# Patient Record
Sex: Female | Born: 1970 | Race: White | Hispanic: No | Marital: Married | Smoking: Never smoker
Health system: Southern US, Community
[De-identification: ages and names within clinical notes are randomized; demographics above are authoritative.]

## PROBLEM LIST (undated history)

## (undated) ENCOUNTER — Emergency Department (HOSPITAL_BASED_OUTPATIENT_CLINIC_OR_DEPARTMENT_OTHER): Discharge status: Absent without leave | Payer: BC Managed Care – PPO

## (undated) DIAGNOSIS — K219 Gastro-esophageal reflux disease without esophagitis: Secondary | ICD-10-CM

## (undated) DIAGNOSIS — R928 Other abnormal and inconclusive findings on diagnostic imaging of breast: Secondary | ICD-10-CM

## (undated) DIAGNOSIS — Z0000 Encounter for general adult medical examination without abnormal findings: Secondary | ICD-10-CM

## (undated) DIAGNOSIS — F419 Anxiety disorder, unspecified: Secondary | ICD-10-CM

## (undated) DIAGNOSIS — Z5189 Encounter for other specified aftercare: Secondary | ICD-10-CM

## (undated) DIAGNOSIS — S329XXA Fracture of unspecified parts of lumbosacral spine and pelvis, initial encounter for closed fracture: Secondary | ICD-10-CM

## (undated) DIAGNOSIS — B019 Varicella without complication: Secondary | ICD-10-CM

## (undated) DIAGNOSIS — T7840XA Allergy, unspecified, initial encounter: Secondary | ICD-10-CM

## (undated) DIAGNOSIS — R198 Other specified symptoms and signs involving the digestive system and abdomen: Secondary | ICD-10-CM

## (undated) DIAGNOSIS — M25511 Pain in right shoulder: Secondary | ICD-10-CM

## (undated) DIAGNOSIS — R05 Cough: Secondary | ICD-10-CM

## (undated) DIAGNOSIS — M461 Sacroiliitis, not elsewhere classified: Secondary | ICD-10-CM

## (undated) DIAGNOSIS — M25552 Pain in left hip: Secondary | ICD-10-CM

## (undated) HISTORY — PX: WISDOM TOOTH EXTRACTION: SHX21

## (undated) HISTORY — PX: FRACTURE SURGERY: SHX138

## (undated) HISTORY — PX: TOTAL HIP ARTHROPLASTY: SHX124

## (undated) HISTORY — PX: APPENDECTOMY: SHX54

## (undated) HISTORY — PX: TONSILLECTOMY: SUR1361

## (undated) HISTORY — DX: Pain in right shoulder: M25.511

## (undated) HISTORY — PX: BREAST EXCISIONAL BIOPSY: SUR124

## (undated) HISTORY — DX: Sacroiliitis, not elsewhere classified: M46.1

## (undated) HISTORY — PX: ESOPHAGOGASTRODUODENOSCOPY: SHX1529

## (undated) HISTORY — DX: Other specified symptoms and signs involving the digestive system and abdomen: R19.8

## (undated) HISTORY — DX: Cough: R05

## (undated) HISTORY — DX: Encounter for other specified aftercare: Z51.89

## (undated) HISTORY — PX: AUGMENTATION MAMMAPLASTY: SUR837

## (undated) HISTORY — DX: Varicella without complication: B01.9

## (undated) HISTORY — DX: Pain in left hip: M25.552

## (undated) HISTORY — PX: BREAST BIOPSY: SHX20

## (undated) HISTORY — DX: Encounter for general adult medical examination without abnormal findings: Z00.00

## (undated) HISTORY — DX: Allergy, unspecified, initial encounter: T78.40XA

---
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
## 1994-04-26 HISTORY — PX: BLADDER SURGERY: SHX569

## 1998-04-08 ENCOUNTER — Other Ambulatory Visit: Admission: RE | Admit: 1998-04-08 | Discharge: 1998-04-08 | Payer: Self-pay | Admitting: Gynecology

## 1999-04-23 ENCOUNTER — Other Ambulatory Visit: Admission: RE | Admit: 1999-04-23 | Discharge: 1999-04-23 | Payer: Self-pay | Admitting: Gynecology

## 1999-07-02 ENCOUNTER — Encounter: Payer: Self-pay | Admitting: Obstetrics and Gynecology

## 1999-07-02 ENCOUNTER — Ambulatory Visit (HOSPITAL_COMMUNITY): Admission: RE | Admit: 1999-07-02 | Discharge: 1999-07-02 | Payer: Self-pay | Admitting: Obstetrics and Gynecology

## 2000-10-24 ENCOUNTER — Other Ambulatory Visit: Admission: RE | Admit: 2000-10-24 | Discharge: 2000-10-24 | Payer: Self-pay | Admitting: Obstetrics and Gynecology

## 2001-06-21 ENCOUNTER — Other Ambulatory Visit: Admission: RE | Admit: 2001-06-21 | Discharge: 2001-06-21 | Payer: Self-pay | Admitting: Gynecology

## 2002-07-11 ENCOUNTER — Other Ambulatory Visit: Admission: RE | Admit: 2002-07-11 | Discharge: 2002-07-11 | Payer: Self-pay | Admitting: Obstetrics and Gynecology

## 2003-07-17 ENCOUNTER — Other Ambulatory Visit: Admission: RE | Admit: 2003-07-17 | Discharge: 2003-07-17 | Payer: Self-pay | Admitting: Obstetrics and Gynecology

## 2004-08-21 ENCOUNTER — Other Ambulatory Visit: Admission: RE | Admit: 2004-08-21 | Discharge: 2004-08-21 | Payer: Self-pay | Admitting: Obstetrics and Gynecology

## 2006-04-26 HISTORY — PX: BREAST SURGERY: SHX581

## 2006-04-26 HISTORY — PX: BREAST ENHANCEMENT SURGERY: SHX7

## 2007-09-25 ENCOUNTER — Inpatient Hospital Stay (HOSPITAL_COMMUNITY): Admission: AD | Admit: 2007-09-25 | Discharge: 2007-09-25 | Payer: Self-pay | Admitting: Obstetrics and Gynecology

## 2009-01-29 ENCOUNTER — Inpatient Hospital Stay (HOSPITAL_COMMUNITY): Admission: AD | Admit: 2009-01-29 | Discharge: 2009-02-05 | Payer: Self-pay | Admitting: Obstetrics and Gynecology

## 2009-01-29 ENCOUNTER — Ambulatory Visit: Payer: Self-pay | Admitting: Internal Medicine

## 2009-01-29 ENCOUNTER — Ambulatory Visit: Payer: Self-pay | Admitting: Family Medicine

## 2009-02-01 ENCOUNTER — Encounter (HOSPITAL_COMMUNITY): Payer: Self-pay | Admitting: Obstetrics and Gynecology

## 2009-02-06 ENCOUNTER — Encounter: Admission: RE | Admit: 2009-02-06 | Discharge: 2009-03-08 | Payer: Self-pay | Admitting: Obstetrics and Gynecology

## 2009-03-09 ENCOUNTER — Encounter: Admission: RE | Admit: 2009-03-09 | Discharge: 2009-04-07 | Payer: Self-pay | Admitting: Obstetrics and Gynecology

## 2009-03-13 ENCOUNTER — Ambulatory Visit: Payer: Self-pay | Admitting: Physician Assistant

## 2009-03-13 ENCOUNTER — Inpatient Hospital Stay (HOSPITAL_COMMUNITY): Admission: AD | Admit: 2009-03-13 | Discharge: 2009-03-13 | Payer: Self-pay | Admitting: Obstetrics and Gynecology

## 2009-04-08 ENCOUNTER — Encounter: Admission: RE | Admit: 2009-04-08 | Discharge: 2009-04-24 | Payer: Self-pay | Admitting: Obstetrics and Gynecology

## 2009-05-09 ENCOUNTER — Encounter: Admission: RE | Admit: 2009-05-09 | Discharge: 2009-06-08 | Payer: Self-pay | Admitting: Obstetrics and Gynecology

## 2009-06-09 ENCOUNTER — Encounter: Admission: RE | Admit: 2009-06-09 | Discharge: 2009-07-09 | Payer: Self-pay | Admitting: Obstetrics and Gynecology

## 2009-08-07 ENCOUNTER — Encounter: Admission: RE | Admit: 2009-08-07 | Discharge: 2009-09-06 | Payer: Self-pay | Admitting: Obstetrics and Gynecology

## 2009-09-07 ENCOUNTER — Encounter: Admission: RE | Admit: 2009-09-07 | Discharge: 2009-10-07 | Payer: Self-pay | Admitting: Obstetrics and Gynecology

## 2009-10-08 ENCOUNTER — Encounter: Admission: RE | Admit: 2009-10-08 | Discharge: 2009-11-07 | Payer: Self-pay | Admitting: Obstetrics and Gynecology

## 2009-11-08 ENCOUNTER — Encounter: Admission: RE | Admit: 2009-11-08 | Discharge: 2009-12-08 | Payer: Self-pay | Admitting: Obstetrics and Gynecology

## 2009-12-09 ENCOUNTER — Encounter: Admission: RE | Admit: 2009-12-09 | Discharge: 2010-01-08 | Payer: Self-pay | Admitting: Obstetrics and Gynecology

## 2010-01-09 ENCOUNTER — Encounter: Admission: RE | Admit: 2010-01-09 | Discharge: 2010-01-14 | Payer: Self-pay | Admitting: Obstetrics and Gynecology

## 2010-07-29 LAB — DIFFERENTIAL
Basophils Absolute: 0 10*3/uL (ref 0.0–0.1)
Basophils Relative: 0 % (ref 0–1)
Eosinophils Absolute: 0.3 10*3/uL (ref 0.0–0.7)
Lymphocytes Relative: 32 % (ref 12–46)
Lymphs Abs: 1.9 10*3/uL (ref 0.7–4.0)
Monocytes Relative: 12 % (ref 3–12)
Neutrophils Relative %: 50 % (ref 43–77)

## 2010-07-29 LAB — BLOOD GAS, ARTERIAL
Acid-Base Excess: 0 mmol/L (ref 0.0–2.0)
FIO2: 0.21 %
O2 Saturation: 99 %
TCO2: 26 mmol/L (ref 0–100)
pCO2 arterial: 42.8 mmHg (ref 35.0–45.0)
pO2, Arterial: 86.8 mmHg (ref 80.0–100.0)

## 2010-07-29 LAB — CBC
Hemoglobin: 11.7 g/dL — ABNORMAL LOW (ref 12.0–15.0)
Platelets: 221 10*3/uL (ref 150–400)
RDW: 12.4 % (ref 11.5–15.5)

## 2010-07-30 LAB — BLOOD GAS, ARTERIAL
Drawn by: 24517
O2 Saturation: 100 %
O2 Saturation: 97 %
TCO2: 24.9 mmol/L (ref 0–100)
TCO2: 26.6 mmol/L (ref 0–100)
pCO2 arterial: 35 mmHg (ref 35.0–45.0)
pH, Arterial: 7.475 — ABNORMAL HIGH (ref 7.350–7.400)
pO2, Arterial: 148 mmHg — ABNORMAL HIGH (ref 80.0–100.0)
pO2, Arterial: 74.7 mmHg — ABNORMAL LOW (ref 80.0–100.0)

## 2010-07-30 LAB — COMPREHENSIVE METABOLIC PANEL
AST: 26 U/L (ref 0–37)
Albumin: 2.7 g/dL — ABNORMAL LOW (ref 3.5–5.2)
Alkaline Phosphatase: 89 U/L (ref 39–117)
Alkaline Phosphatase: 97 U/L (ref 39–117)
BUN: 5 mg/dL — ABNORMAL LOW (ref 6–23)
CO2: 25 mEq/L (ref 19–32)
CO2: 26 mEq/L (ref 19–32)
Creatinine, Ser: 0.38 mg/dL — ABNORMAL LOW (ref 0.4–1.2)
Creatinine, Ser: 0.54 mg/dL (ref 0.4–1.2)
GFR calc Af Amer: >60 mL/min (ref >60)
GFR calc Af Amer: >60 mL/min (ref >60)
GFR calc non Af Amer: >60 mL/min (ref >60)
Glucose, Bld: 119 mg/dL — ABNORMAL HIGH (ref 70–99)
Sodium: 137 mEq/L (ref 135–145)
Total Bilirubin: 0.5 mg/dL (ref 0.3–1.2)
Total Bilirubin: 0.7 mg/dL (ref 0.3–1.2)
Total Protein: 5.3 g/dL — ABNORMAL LOW (ref 6.0–8.3)
Total Protein: 5.9 g/dL — ABNORMAL LOW (ref 6.0–8.3)

## 2010-07-30 LAB — STREP B DNA PROBE: Strep Group B Ag: NEGATIVE

## 2010-07-30 LAB — ABO/RH: ABO/RH(D): O POS

## 2010-07-30 LAB — CBC
HCT: 27.3 % — ABNORMAL LOW (ref 36.0–46.0)
Hemoglobin: 10.4 g/dL — ABNORMAL LOW (ref 12.0–15.0)
Hemoglobin: 8.6 g/dL — ABNORMAL LOW (ref 12.0–15.0)
Hemoglobin: 9.1 g/dL — ABNORMAL LOW (ref 12.0–15.0)
MCV: 94.2 fL (ref 78.0–100.0)
RBC: 2.9 MIL/uL — ABNORMAL LOW (ref 3.87–5.11)
RDW: 13.3 % (ref 11.5–15.5)
WBC: 13.5 10*3/uL — ABNORMAL HIGH (ref 4.0–10.5)

## 2010-07-30 LAB — MAGNESIUM
Magnesium: 3.9 mg/dL — ABNORMAL HIGH (ref 1.5–2.5)
Magnesium: 4.1 mg/dL — ABNORMAL HIGH (ref 1.5–2.5)

## 2010-07-30 LAB — TYPE AND SCREEN: Antibody Screen: NEGATIVE

## 2011-04-27 HISTORY — PX: TUBAL LIGATION: SHX77

## 2011-08-12 ENCOUNTER — Other Ambulatory Visit (HOSPITAL_COMMUNITY): Payer: Self-pay | Admitting: Obstetrics and Gynecology

## 2011-08-12 DIAGNOSIS — N971 Female infertility of tubal origin: Secondary | ICD-10-CM

## 2011-09-22 ENCOUNTER — Ambulatory Visit (HOSPITAL_COMMUNITY)
Admission: RE | Admit: 2011-09-22 | Discharge: 2011-09-22 | Disposition: A | Discharge status: Home / Self Care | Payer: BC Managed Care – PPO | Source: Ambulatory Visit | Attending: Obstetrics and Gynecology | Admitting: Obstetrics and Gynecology

## 2011-09-22 DIAGNOSIS — N971 Female infertility of tubal origin: Secondary | ICD-10-CM

## 2011-09-22 DIAGNOSIS — Z3049 Encounter for surveillance of other contraceptives: Secondary | ICD-10-CM | POA: Insufficient documentation

## 2011-09-22 MED ORDER — IOHEXOL 300 MG/ML  SOLN: 3.0000 mL | Freq: Once | INTRAMUSCULAR | Status: AC | PRN

## 2012-04-26 HISTORY — PX: ABLATION: SHX5711

## 2012-05-26 ENCOUNTER — Other Ambulatory Visit: Payer: Self-pay | Admitting: Obstetrics and Gynecology

## 2012-07-21 ENCOUNTER — Other Ambulatory Visit: Payer: Self-pay | Admitting: Obstetrics and Gynecology

## 2013-05-25 LAB — HM PAP SMEAR: HM Pap smear: NORMAL

## 2013-05-25 LAB — HM MAMMOGRAPHY: HM MAMMO: NORMAL

## 2013-06-01 ENCOUNTER — Other Ambulatory Visit: Payer: Self-pay | Admitting: Obstetrics and Gynecology

## 2013-08-10 ENCOUNTER — Ambulatory Visit: Payer: Self-pay | Admitting: Family Medicine

## 2013-11-02 ENCOUNTER — Emergency Department (HOSPITAL_BASED_OUTPATIENT_CLINIC_OR_DEPARTMENT_OTHER): Payer: BC Managed Care – PPO

## 2013-11-02 ENCOUNTER — Emergency Department (HOSPITAL_BASED_OUTPATIENT_CLINIC_OR_DEPARTMENT_OTHER)
Admission: EM | Admit: 2013-11-02 | Discharge: 2013-11-02 | Disposition: A | Discharge status: Home / Self Care | Payer: BC Managed Care – PPO | Attending: Emergency Medicine | Admitting: Emergency Medicine

## 2013-11-02 ENCOUNTER — Encounter (HOSPITAL_BASED_OUTPATIENT_CLINIC_OR_DEPARTMENT_OTHER): Payer: Self-pay | Admitting: Emergency Medicine

## 2013-11-02 DIAGNOSIS — Z79899 Other long term (current) drug therapy: Secondary | ICD-10-CM | POA: Insufficient documentation

## 2013-11-02 DIAGNOSIS — Y9289 Other specified places as the place of occurrence of the external cause: Secondary | ICD-10-CM | POA: Insufficient documentation

## 2013-11-02 DIAGNOSIS — Z8781 Personal history of (healed) traumatic fracture: Secondary | ICD-10-CM | POA: Insufficient documentation

## 2013-11-02 DIAGNOSIS — S8990XA Unspecified injury of unspecified lower leg, initial encounter: Secondary | ICD-10-CM | POA: Insufficient documentation

## 2013-11-02 DIAGNOSIS — W208XXA Other cause of strike by thrown, projected or falling object, initial encounter: Secondary | ICD-10-CM | POA: Insufficient documentation

## 2013-11-02 DIAGNOSIS — S99929A Unspecified injury of unspecified foot, initial encounter: Principal | ICD-10-CM

## 2013-11-02 DIAGNOSIS — S99919A Unspecified injury of unspecified ankle, initial encounter: Principal | ICD-10-CM

## 2013-11-02 DIAGNOSIS — K219 Gastro-esophageal reflux disease without esophagitis: Secondary | ICD-10-CM | POA: Insufficient documentation

## 2013-11-02 DIAGNOSIS — S99921A Unspecified injury of right foot, initial encounter: Secondary | ICD-10-CM

## 2013-11-02 DIAGNOSIS — Y9389 Activity, other specified: Secondary | ICD-10-CM | POA: Insufficient documentation

## 2013-11-02 HISTORY — DX: Fracture of unspecified parts of lumbosacral spine and pelvis, initial encounter for closed fracture: S32.9XXA

## 2013-11-02 HISTORY — DX: Gastro-esophageal reflux disease without esophagitis: K21.9

## 2013-11-02 MED ORDER — HYDROCODONE-ACETAMINOPHEN 5-325 MG PO TABS: 1.0000 | ORAL_TABLET | Freq: Four times a day (QID) | ORAL | Status: DC | PRN

## 2013-11-02 MED ORDER — IBUPROFEN 800 MG PO TABS
800.0000 mg | ORAL_TABLET | Freq: Once | ORAL | Status: AC
  Administered 2013-11-02: 800 mg via ORAL
  Filled 2013-11-02: qty 1

## 2013-11-02 MED ORDER — HYDROCODONE-ACETAMINOPHEN 5-325 MG PO TABS
2.0000 | ORAL_TABLET | Freq: Once | ORAL | Status: AC
  Administered 2013-11-02: 2 via ORAL
  Filled 2013-11-02: qty 2

## 2013-11-02 NOTE — ED Provider Notes (Signed)
CSN: [id number]     Arrival date & time 11/02/13  1818 History  This chart was scribed for Wandra Arthurs, MD by Cathie Hoops, ED Scribe. The patient was seen in [place]. The patient's care was started at 7:18 PM.     Chief Complaint  Patient presents with  . Foot Injury   The history is provided by the patient. No language interpreter was used.   HPI Comments: Sherry Allen is a 43 y.o. female who presents to the Emergency Department complaining of new, right foot injury onset immediately prior to arrival. Pt states that she dropped a tree on her right foot while she was trying to moving it out of her way when the injury occurred. Pt reports associated increased right foot pain with ROM. Pt denies taking anything to relieve symptoms. Pt denies nausea, vomiting, dizziness and weakness.    Past Medical History  Diagnosis Date  . GERD (gastroesophageal reflux disease)   . Pelvic fracture    Past Surgical History  Procedure Laterality Date  . Appendectomy    . Cesarean section    . Tubal ligation     History reviewed. No pertinent family history. History  Substance Use Topics  . Smoking status: Never Smoker   . Smokeless tobacco: Not on file  . Alcohol Use: No   OB History   Grav Para Term Preterm Abortions TAB SAB Ect Mult Living                 Review of Systems  Gastrointestinal: Negative for nausea and vomiting.  Musculoskeletal: Positive for arthralgias (right foot).  Neurological: Negative for dizziness and weakness.  All other systems reviewed and are negative.     Allergies  Sulfa antibiotics  Home Medications   Prior to Admission medications   Medication Sig Start Date End Date Taking? Authorizing Provider  pantoprazole (PROTONIX) 40 MG tablet Take 40 mg by mouth daily.   Yes Historical Provider, MD   Triage Vitals: BP 100/58  Pulse 64  Temp(Src) 98 F (36.7 C) (Oral)  Resp 16  Ht 5\' 7"  (1.702 m)  Wt 140 lb (63.504 kg)  BMI 21.92 kg/m2  SpO2  100% Physical Exam  Nursing note and vitals reviewed. Constitutional: She is oriented to person, place, and time. She appears well-developed and well-nourished. No distress.  HENT:  Head: Normocephalic and atraumatic.  Eyes: Conjunctivae and EOM are normal.  Neck: Neck supple. No tracheal deviation present.  Cardiovascular: Normal rate.   Good pulses.  Pulmonary/Chest: Effort normal. No respiratory distress.  Musculoskeletal: Normal range of motion. She exhibits tenderness.  Tenderness to right lateral malleolus, mid foot, base of the fifth. Pt is able to wiggle toes. Neurovascular intact.  Neurological: She is alert and oriented to person, place, and time.  Skin: Skin is warm and dry.  Psychiatric: She has a normal mood and affect. Her behavior is normal.    ED Course  Procedures (including critical care time) DIAGNOSTIC STUDIES: Oxygen Saturation is 100% on RA, normal by my interpretation.    COORDINATION OF CARE: 7:21 PM- Patient informed of current plan for treatment and evaluation and agrees with plan at this time.  Labs Review Labs Reviewed - No data to display  Imaging Review Dg Foot Complete Right  11/02/2013   CLINICAL DATA:  Right foot pain, trauma  EXAM: RIGHT FOOT COMPLETE - 3+ VIEW  COMPARISON:  None.  FINDINGS: There is no evidence of fracture or dislocation. There is no evidence  of arthropathy or other focal bone abnormality. Soft tissues are unremarkable. Minimal plantar calcaneal spurring.  IMPRESSION: Negative.   Electronically Signed   By: Conchita Paris M.D.   On: 11/02/2013 18:55     EKG Interpretation None      MDM   Final diagnoses:  None   [name] is a 43 y.o. female here with R foot injury. Xray showed no fracture. Given ankle air cast, crutches. Will d/c home on motrin, vicodin prn.    I personally performed the services described in this documentation, which was scribed in my presence. The recorded information has been reviewed and  is accurate.   Wandra Arthurs, MD 11/02/13 [phone]

## 2013-11-02 NOTE — Discharge Instructions (Signed)
Take motrin 600 mg every 6 hrs for pain.   Take vicodin for severe pain. Do NOT drive with it.   Use ankle air cast and crutches.   Keep foot elevated.   Follow up with your doctor.   Return to ER if you have severe pain, unable to walk.

## 2013-11-02 NOTE — ED Notes (Signed)
Pt c/o right foot injury x 1 hr ago

## 2013-12-07 ENCOUNTER — Ambulatory Visit: Payer: Self-pay | Admitting: Family Medicine

## 2014-01-25 ENCOUNTER — Encounter: Payer: Self-pay | Admitting: Family Medicine

## 2014-01-25 ENCOUNTER — Other Ambulatory Visit (HOSPITAL_COMMUNITY)
Admission: RE | Admit: 2014-01-25 | Discharge: 2014-01-25 | Disposition: A | Discharge status: Home / Self Care | Payer: BC Managed Care – PPO | Source: Ambulatory Visit | Attending: Family Medicine | Admitting: Family Medicine

## 2014-01-25 ENCOUNTER — Ambulatory Visit (INDEPENDENT_AMBULATORY_CARE_PROVIDER_SITE_OTHER): Payer: BC Managed Care – PPO | Admitting: Family Medicine

## 2014-01-25 VITALS — BP 104/58 | HR 85 | Temp 98.7°F | Ht 67.0 in | Wt 134.6 lb

## 2014-01-25 DIAGNOSIS — R1310 Dysphagia, unspecified: Secondary | ICD-10-CM

## 2014-01-25 DIAGNOSIS — R1013 Epigastric pain: Secondary | ICD-10-CM

## 2014-01-25 DIAGNOSIS — R05 Cough: Secondary | ICD-10-CM

## 2014-01-25 DIAGNOSIS — R059 Cough, unspecified: Secondary | ICD-10-CM

## 2014-01-25 DIAGNOSIS — N760 Acute vaginitis: Secondary | ICD-10-CM | POA: Diagnosis present

## 2014-01-25 DIAGNOSIS — K219 Gastro-esophageal reflux disease without esophagitis: Secondary | ICD-10-CM

## 2014-01-25 DIAGNOSIS — B019 Varicella without complication: Secondary | ICD-10-CM | POA: Insufficient documentation

## 2014-01-25 DIAGNOSIS — M25511 Pain in right shoulder: Secondary | ICD-10-CM

## 2014-01-25 DIAGNOSIS — N3000 Acute cystitis without hematuria: Secondary | ICD-10-CM

## 2014-01-25 DIAGNOSIS — M25552 Pain in left hip: Secondary | ICD-10-CM

## 2014-01-25 DIAGNOSIS — R8290 Unspecified abnormal findings in urine: Secondary | ICD-10-CM

## 2014-01-25 HISTORY — DX: Cough, unspecified: R05.9

## 2014-01-25 LAB — CBC
HEMATOCRIT: 40.6 % (ref 36.0–46.0)
Hemoglobin: 13.7 g/dL (ref 12.0–15.0)
MCH: 29.3 pg (ref 26.0–34.0)
MCHC: 33.7 g/dL (ref 30.0–36.0)
MCV: 86.9 fL (ref 78.0–100.0)
Platelets: 309 10*3/uL (ref 150–400)
RBC: 4.67 MIL/uL (ref 3.87–5.11)
RDW: 12.9 % (ref 11.5–15.5)
WBC: 6 10*3/uL (ref 4.0–10.5)

## 2014-01-25 LAB — TSH: TSH: 1.107 u[IU]/mL (ref 0.350–4.500)

## 2014-01-25 MED ORDER — HYDROCODONE-ACETAMINOPHEN 5-325 MG PO TABS: 1.0000 | ORAL_TABLET | Freq: Four times a day (QID) | ORAL | Status: DC | PRN

## 2014-01-25 MED ORDER — RANITIDINE HCL 300 MG PO TABS: 300.0000 mg | ORAL_TABLET | Freq: Every day | ORAL | Status: DC

## 2014-01-25 MED ORDER — PANTOPRAZOLE SODIUM 40 MG PO TBEC: 40.0000 mg | DELAYED_RELEASE_TABLET | Freq: Every day | ORAL | Status: DC

## 2014-01-25 NOTE — Progress Notes (Signed)
Pre visit review using our clinic review tool, if applicable. No additional management support is needed unless otherwise documented below in the visit note. 

## 2014-01-25 NOTE — Patient Instructions (Addendum)
Consider krill oil such as MegaRed daily by [employer] or the Krill oil caps by NOW Probiotic daily such as [employer]est[employer]vantage or [employer] Vitamin D 2000 IU Daily Curcumen daily [place] at [email or web address]  Zyrtec 10 mg daily and 2 Tums at bed       Preventive Care for Adults A healthy lifestyle and preventive care can promote health and wellness. Preventive health guidelines for women include the following key practices.  A routine yearly physical is a good way to check with your health care provider about your health and preventive screening. It is a chance to share any concerns and updates on your health and to receive a thorough exam.  Visit your dentist for a routine exam and preventive care every 6 months. Brush your teeth twice a day and floss once a day. Good oral hygiene prevents tooth decay and gum disease.  The frequency of eye exams is based on your age, health, family medical history, use of contact lenses, and other factors. Follow your health care provider's recommendations for frequency of eye exams.  Eat a healthy diet. Foods like vegetables, fruits, whole grains, low-fat dairy products, and lean protein foods contain the nutrients you need without too many calories. Decrease your intake of foods high in solid fats, added sugars, and salt. Eat the right amount of calories for you.Get information about a proper diet from your health care provider, if necessary.  Regular physical exercise is one of the most important things you can do for your health. Most adults should get at least 150 minutes of moderate-intensity exercise (any activity that increases your heart rate and causes you to sweat) each week. In addition, most adults need muscle-strengthening exercises on 2 or more days a week.  Maintain a healthy weight. The body mass index (BMI) is a screening tool to identify possible weight problems. It provides an estimate of body fat based on height and  weight. Your health care provider can find your BMI and can help you achieve or maintain a healthy weight.For adults 20 years and older:  A BMI below 18.5 is considered underweight.  A BMI of 18.5 to 24.9 is normal.  A BMI of 25 to 29.9 is considered overweight.  A BMI of 30 and above is considered obese.  Maintain normal blood lipids and cholesterol levels by exercising and minimizing your intake of saturated fat. Eat a balanced diet with plenty of fruit and vegetables. Blood tests for lipids and cholesterol should begin at age 57 and be repeated every 5 years. If your lipid or cholesterol levels are high, you are over 50, or you are at high risk for heart disease, you may need your cholesterol levels checked more frequently.Ongoing high lipid and cholesterol levels should be treated with medicines if diet and exercise are not working.  If you smoke, find out from your health care provider how to quit. If you do not use tobacco, do not start.  Lung cancer screening is recommended for adults aged 76-80 years who are at high risk for developing lung cancer because of a history of smoking. A yearly low-dose CT scan of the lungs is recommended for people who have at least a 30-pack-year history of smoking and are a current smoker or have quit within the past 15 years. A pack year of smoking is smoking an average of 1 pack of cigarettes a day for 1 year (for example: 1 pack a day for 30 years or 2 packs a  day for 15 years). Yearly screening should continue until the smoker has stopped smoking for at least 15 years. Yearly screening should be stopped for people who develop a health problem that would prevent them from having lung cancer treatment.  If you are pregnant, do not drink alcohol. If you are breastfeeding, be very cautious about drinking alcohol. If you are not pregnant and choose to drink alcohol, do not have more than 1 drink per day. One drink is considered to be 12 ounces (355 mL) of beer,  5 ounces (148 mL) of wine, or 1.5 ounces (44 mL) of liquor.  Avoid use of street drugs. Do not share needles with anyone. Ask for help if you need support or instructions about stopping the use of drugs.  High blood pressure causes heart disease and increases the risk of stroke. Your blood pressure should be checked at least every 1 to 2 years. Ongoing high blood pressure should be treated with medicines if weight loss and exercise do not work.  If you are 60-33 years old, ask your health care provider if you should take aspirin to prevent strokes.  Diabetes screening involves taking a blood sample to check your fasting blood sugar level. This should be done once every 3 years, after age 75, if you are within normal weight and without risk factors for diabetes. Testing should be considered at a younger age or be carried out more frequently if you are overweight and have at least 1 risk factor for diabetes.  Breast cancer screening is essential preventive care for women. You should practice "breast self-awareness." This means understanding the normal appearance and feel of your breasts and may include breast self-examination. Any changes detected, no matter how small, should be reported to a health care provider. Women in their 34s and 30s should have a clinical breast exam (CBE) by a health care provider as part of a regular health exam every 1 to 3 years. After age 24, women should have a CBE every year. Starting at age 55, women should consider having a mammogram (breast X-ray test) every year. Women who have a family history of breast cancer should talk to their health care provider about genetic screening. Women at a high risk of breast cancer should talk to their health care providers about having an MRI and a mammogram every year.  Breast cancer gene (BRCA)-related cancer risk assessment is recommended for women who have family members with BRCA-related cancers. BRCA-related cancers include breast,  ovarian, tubal, and peritoneal cancers. Having family members with these cancers may be associated with an increased risk for harmful changes (mutations) in the breast cancer genes BRCA1 and BRCA2. Results of the assessment will determine the need for genetic counseling and BRCA1 and BRCA2 testing.  Routine pelvic exams to screen for cancer are no longer recommended for nonpregnant women who are considered low risk for cancer of the pelvic organs (ovaries, uterus, and vagina) and who do not have symptoms. Ask your health care provider if a screening pelvic exam is right for you.  If you have had past treatment for cervical cancer or a condition that could lead to cancer, you need Pap tests and screening for cancer for at least 20 years after your treatment. If Pap tests have been discontinued, your risk factors (such as having a new sexual partner) need to be reassessed to determine if screening should be resumed. Some women have medical problems that increase the chance of getting cervical cancer. In these cases,  your health care provider may recommend more frequent screening and Pap tests.  The HPV test is an additional test that may be used for cervical cancer screening. The HPV test looks for the virus that can cause the cell changes on the cervix. The cells collected during the Pap test can be tested for HPV. The HPV test could be used to screen women aged 28 years and older, and should be used in women of any age who have unclear Pap test results. After the age of 59, women should have HPV testing at the same frequency as a Pap test.  Colorectal cancer can be detected and often prevented. Most routine colorectal cancer screening begins at the age of 92 years and continues through age 38 years. However, your health care provider may recommend screening at an earlier age if you have risk factors for colon cancer. On a yearly basis, your health care provider may provide home test kits to check for hidden  blood in the stool. Use of a small camera at the end of a tube, to directly examine the colon (sigmoidoscopy or colonoscopy), can detect the earliest forms of colorectal cancer. Talk to your health care provider about this at age 20, when routine screening begins. Direct exam of the colon should be repeated every 5-10 years through age 38 years, unless early forms of pre-cancerous polyps or small growths are found.  People who are at an increased risk for hepatitis B should be screened for this virus. You are considered at high risk for hepatitis B if:  You were born in a country where hepatitis B occurs often. Talk with your health care provider about which countries are considered high risk.  Your parents were born in a high-risk country and you have not received a shot to protect against hepatitis B (hepatitis B vaccine).  You have HIV or AIDS.  You use needles to inject street drugs.  You live with, or have sex with, someone who has hepatitis B.  You get hemodialysis treatment.  You take certain medicines for conditions like cancer, organ transplantation, and autoimmune conditions.  Hepatitis C blood testing is recommended for all people born from [birth year] through 1965 and any individual with known risks for hepatitis C.  Practice safe sex. Use condoms and avoid high-risk sexual practices to reduce the spread of sexually transmitted infections (STIs). STIs include gonorrhea, chlamydia, syphilis, trichomonas, herpes, HPV, and human immunodeficiency virus (HIV). Herpes, HIV, and HPV are viral illnesses that have no cure. They can result in disability, cancer, and death.  You should be screened for sexually transmitted illnesses (STIs) including gonorrhea and chlamydia if:  You are sexually active and are younger than 24 years.  You are older than 24 years and your health care provider tells you that you are at risk for this type of infection.  Your sexual activity has changed since you  were last screened and you are at an increased risk for chlamydia or gonorrhea. Ask your health care provider if you are at risk.  If you are at risk of being infected with HIV, it is recommended that you take a prescription medicine daily to prevent HIV infection. This is called preexposure prophylaxis (PrEP). You are considered at risk if:  You are a heterosexual woman, are sexually active, and are at increased risk for HIV infection.  You take drugs by injection.  You are sexually active with a partner who has HIV.  Talk with your health care provider about  whether you are at high risk of being infected with HIV. If you choose to begin PrEP, you should first be tested for HIV. You should then be tested every 3 months for as long as you are taking PrEP.  Osteoporosis is a disease in which the bones lose minerals and strength with aging. This can result in serious bone fractures or breaks. The risk of osteoporosis can be identified using a bone density scan. Women ages 3 years and over and women at risk for fractures or osteoporosis should discuss screening with their health care providers. Ask your health care provider whether you should take a calcium supplement or vitamin D to reduce the rate of osteoporosis.  Menopause can be associated with physical symptoms and risks. Hormone replacement therapy is available to decrease symptoms and risks. You should talk to your health care provider about whether hormone replacement therapy is right for you.  Use sunscreen. Apply sunscreen liberally and repeatedly throughout the day. You should seek shade when your shadow is shorter than you. Protect yourself by wearing long sleeves, pants, a wide-brimmed hat, and sunglasses year round, whenever you are outdoors.  Once a month, do a whole body skin exam, using a mirror to look at the skin on your back. Tell your health care provider of new moles, moles that have irregular borders, moles that are larger  than a pencil eraser, or moles that have changed in shape or color.  Stay current with required vaccines (immunizations).  Influenza vaccine. All adults should be immunized every year.  Tetanus, diphtheria, and acellular pertussis (Td, Tdap) vaccine. Pregnant women should receive 1 dose of Tdap vaccine during each pregnancy. The dose should be obtained regardless of the length of time since the last dose. Immunization is preferred during the 27th-36th week of gestation. An adult who has not previously received Tdap or who does not know her vaccine status should receive 1 dose of Tdap. This initial dose should be followed by tetanus and diphtheria toxoids (Td) booster doses every 10 years. Adults with an unknown or incomplete history of completing a 3-dose immunization series with Td-containing vaccines should begin or complete a primary immunization series including a Tdap dose. Adults should receive a Td booster every 10 years.  Varicella vaccine. An adult without evidence of immunity to varicella should receive 2 doses or a second dose if she has previously received 1 dose. Pregnant females who do not have evidence of immunity should receive the first dose after pregnancy. This first dose should be obtained before leaving the health care facility. The second dose should be obtained 4-8 weeks after the first dose.  Human papillomavirus (HPV) vaccine. Females aged 13-26 years who have not received the vaccine previously should obtain the 3-dose series. The vaccine is not recommended for use in pregnant females. However, pregnancy testing is not needed before receiving a dose. If a female is found to be pregnant after receiving a dose, no treatment is needed. In that case, the remaining doses should be delayed until after the pregnancy. Immunization is recommended for any person with an immunocompromised condition through the age of 68 years if she did not get any or all doses earlier. During the 3-dose  series, the second dose should be obtained 4-8 weeks after the first dose. The third dose should be obtained 24 weeks after the first dose and 16 weeks after the second dose.  Zoster vaccine. One dose is recommended for adults aged 64 years or older unless certain  conditions are present.  Measles, mumps, and rubella (MMR) vaccine. Adults born before [birth year] generally are considered immune to measles and mumps. Adults born in [birth year] or later should have 1 or more doses of MMR vaccine unless there is a contraindication to the vaccine or there is laboratory evidence of immunity to each of the three diseases. A routine second dose of MMR vaccine should be obtained at least 28 days after the first dose for students attending postsecondary schools, health care workers, or international travelers. People who received inactivated measles vaccine or an unknown type of measles vaccine during 1963-1967 should receive 2 doses of MMR vaccine. People who received inactivated mumps vaccine or an unknown type of mumps vaccine before 1979 and are at high risk for mumps infection should consider immunization with 2 doses of MMR vaccine. For females of childbearing age, rubella immunity should be determined. If there is no evidence of immunity, females who are not pregnant should be vaccinated. If there is no evidence of immunity, females who are pregnant should delay immunization until after pregnancy. Unvaccinated health care workers born before [birth year] who lack laboratory evidence of measles, mumps, or rubella immunity or laboratory confirmation of disease should consider measles and mumps immunization with 2 doses of MMR vaccine or rubella immunization with 1 dose of MMR vaccine.  Pneumococcal 13-valent conjugate (PCV13) vaccine. When indicated, a person who is uncertain of her immunization history and has no record of immunization should receive the PCV13 vaccine. An adult aged 44 years or older who has certain medical conditions  and has not been previously immunized should receive 1 dose of PCV13 vaccine. This PCV13 should be followed with a dose of pneumococcal polysaccharide (PPSV23) vaccine. The PPSV23 vaccine dose should be obtained at least 8 weeks after the dose of PCV13 vaccine. An adult aged 41 years or older who has certain medical conditions and previously received 1 or more doses of PPSV23 vaccine should receive 1 dose of PCV13. The PCV13 vaccine dose should be obtained 1 or more years after the last PPSV23 vaccine dose.  Pneumococcal polysaccharide (PPSV23) vaccine. When PCV13 is also indicated, PCV13 should be obtained first. All adults aged 1 years and older should be immunized. An adult younger than age 46 years who has certain medical conditions should be immunized. Any person who resides in a nursing home or long-term care facility should be immunized. An adult smoker should be immunized. People with an immunocompromised condition and certain other conditions should receive both PCV13 and PPSV23 vaccines. People with human immunodeficiency virus (HIV) infection should be immunized as soon as possible after diagnosis. Immunization during chemotherapy or radiation therapy should be avoided. Routine use of PPSV23 vaccine is not recommended for American Indians, [place] Natives, or people younger than 65 years unless there are medical conditions that require PPSV23 vaccine. When indicated, people who have unknown immunization and have no record of immunization should receive PPSV23 vaccine. One-time revaccination 5 years after the first dose of PPSV23 is recommended for people aged 19-64 years who have chronic kidney failure, nephrotic syndrome, asplenia, or immunocompromised conditions. People who received 1-2 doses of PPSV23 before age 56 years should receive another dose of PPSV23 vaccine at age 29 years or later if at least 5 years have passed since the previous dose. Doses of PPSV23 are not needed for people immunized  with PPSV23 at or after age 16 years.  Meningococcal vaccine. Adults with asplenia or persistent complement component deficiencies should receive 2 doses of quadrivalent  meningococcal conjugate (MenACWY-D) vaccine. The doses should be obtained at least 2 months apart. Microbiologists working with certain meningococcal bacteria, [employer] recruits, people at risk during an outbreak, and people who travel to or live in countries with a high rate of meningitis should be immunized. A first-year college student up through age 61 years who is living in a residence hall should receive a dose if she did not receive a dose on or after her 16th birthday. Adults who have certain high-risk conditions should receive one or more doses of vaccine.  Hepatitis A vaccine. Adults who wish to be protected from this disease, have certain high-risk conditions, work with hepatitis A-infected animals, work in hepatitis A research labs, or travel to or work in countries with a high rate of hepatitis A should be immunized. Adults who were previously unvaccinated and who anticipate close contact with an international adoptee during the first 60 days after arrival in the [place] from a country with a high rate of hepatitis A should be immunized.  Hepatitis B vaccine. Adults who wish to be protected from this disease, have certain high-risk conditions, may be exposed to blood or other infectious body fluids, are household contacts or sex partners of hepatitis B positive people, are clients or workers in certain care facilities, or travel to or work in countries with a high rate of hepatitis B should be immunized.  Haemophilus influenzae type b (Hib) vaccine. A previously unvaccinated person with asplenia or sickle cell disease or having a scheduled splenectomy should receive 1 dose of Hib vaccine. Regardless of previous immunization, a recipient of a hematopoietic stem cell transplant should receive a 3-dose series 6-12 months  after her successful transplant. Hib vaccine is not recommended for adults with HIV infection. Preventive Services / Frequency Ages 73 to 68 years  Blood pressure check.** / Every 1 to 2 years.  Lipid and cholesterol check.** / Every 5 years beginning at age 38.  Clinical breast exam.** / Every 3 years for women in their 11s and 35s.  BRCA-related cancer risk assessment.** / For women who have family members with a BRCA-related cancer (breast, ovarian, tubal, or peritoneal cancers).  Pap test.** / Every 2 years from ages 5 through 67. Every 3 years starting at age 47 through age 55 or 34 with a history of 3 consecutive normal Pap tests.  HPV screening.** / Every 3 years from ages 40 through ages 66 to 81 with a history of 3 consecutive normal Pap tests.  Hepatitis C blood test.** / For any individual with known risks for hepatitis C.  Skin self-exam. / Monthly.  Influenza vaccine. / Every year.  Tetanus, diphtheria, and acellular pertussis (Tdap, Td) vaccine.** / Consult your health care provider. Pregnant women should receive 1 dose of Tdap vaccine during each pregnancy. 1 dose of Td every 10 years.  Varicella vaccine.** / Consult your health care provider. Pregnant females who do not have evidence of immunity should receive the first dose after pregnancy.  HPV vaccine. / 3 doses over 6 months, if 73 and younger. The vaccine is not recommended for use in pregnant females. However, pregnancy testing is not needed before receiving a dose.  Measles, mumps, rubella (MMR) vaccine.** / You need at least 1 dose of MMR if you were born in 1957 or later. You may also need a 2nd dose. For females of childbearing age, rubella immunity should be determined. If there is no evidence of immunity, females who are not pregnant should be  vaccinated. If there is no evidence of immunity, females who are pregnant should delay immunization until after pregnancy.  Pneumococcal 13-valent conjugate (PCV13)  vaccine.** / Consult your health care provider.  Pneumococcal polysaccharide (PPSV23) vaccine.** / 1 to 2 doses if you smoke cigarettes or if you have certain conditions.  Meningococcal vaccine.** / 1 dose if you are age 88 to 63 years and a Market researcher living in a residence hall, or have one of several medical conditions, you need to get vaccinated against meningococcal disease. You may also need additional booster doses.  Hepatitis A vaccine.** / Consult your health care provider.  Hepatitis B vaccine.** / Consult your health care provider.  Haemophilus influenzae type b (Hib) vaccine.** / Consult your health care provider. Ages 82 to 16 years  Blood pressure check.** / Every 1 to 2 years.  Lipid and cholesterol check.** / Every 5 years beginning at age 71 years.  Lung cancer screening. / Every year if you are aged 41-80 years and have a 30-pack-year history of smoking and currently smoke or have quit within the past 15 years. Yearly screening is stopped once you have quit smoking for at least 15 years or develop a health problem that would prevent you from having lung cancer treatment.  Clinical breast exam.** / Every year after age 11 years.  BRCA-related cancer risk assessment.** / For women who have family members with a BRCA-related cancer (breast, ovarian, tubal, or peritoneal cancers).  Mammogram.** / Every year beginning at age 25 years and continuing for as long as you are in good health. Consult with your health care provider.  Pap test.** / Every 3 years starting at age 59 years through age 62 or 20 years with a history of 3 consecutive normal Pap tests.  HPV screening.** / Every 3 years from ages 58 years through ages 53 to 52 years with a history of 3 consecutive normal Pap tests.  Fecal occult blood test (FOBT) of stool. / Every year beginning at age 43 years and continuing until age 15 years. You may not need to do this test if you get a colonoscopy every  10 years.  Flexible sigmoidoscopy or colonoscopy.** / Every 5 years for a flexible sigmoidoscopy or every 10 years for a colonoscopy beginning at age 83 years and continuing until age 50 years.  Hepatitis C blood test.** / For all people born from [birth year] through 1965 and any individual with known risks for hepatitis C.  Skin self-exam. / Monthly.  Influenza vaccine. / Every year.  Tetanus, diphtheria, and acellular pertussis (Tdap/Td) vaccine.** / Consult your health care provider. Pregnant women should receive 1 dose of Tdap vaccine during each pregnancy. 1 dose of Td every 10 years.  Varicella vaccine.** / Consult your health care provider. Pregnant females who do not have evidence of immunity should receive the first dose after pregnancy.  Zoster vaccine.** / 1 dose for adults aged 3 years or older.  Measles, mumps, rubella (MMR) vaccine.** / You need at least 1 dose of MMR if you were born in 1957 or later. You may also need a 2nd dose. For females of childbearing age, rubella immunity should be determined. If there is no evidence of immunity, females who are not pregnant should be vaccinated. If there is no evidence of immunity, females who are pregnant should delay immunization until after pregnancy.  Pneumococcal 13-valent conjugate (PCV13) vaccine.** / Consult your health care provider.  Pneumococcal polysaccharide (PPSV23) vaccine.** / 1 to 2 doses  if you smoke cigarettes or if you have certain conditions.  Meningococcal vaccine.** / Consult your health care provider.  Hepatitis A vaccine.** / Consult your health care provider.  Hepatitis B vaccine.** / Consult your health care provider.  Haemophilus influenzae type b (Hib) vaccine.** / Consult your health care provider. Ages 62 years and over  Blood pressure check.** / Every 1 to 2 years.  Lipid and cholesterol check.** / Every 5 years beginning at age 60 years.  Lung cancer screening. / Every year if you are aged 18-80  years and have a 30-pack-year history of smoking and currently smoke or have quit within the past 15 years. Yearly screening is stopped once you have quit smoking for at least 15 years or develop a health problem that would prevent you from having lung cancer treatment.  Clinical breast exam.** / Every year after age 85 years.  BRCA-related cancer risk assessment.** / For women who have family members with a BRCA-related cancer (breast, ovarian, tubal, or peritoneal cancers).  Mammogram.** / Every year beginning at age 56 years and continuing for as long as you are in good health. Consult with your health care provider.  Pap test.** / Every 3 years starting at age 82 years through age 67 or 36 years with 3 consecutive normal Pap tests. Testing can be stopped between 65 and 70 years with 3 consecutive normal Pap tests and no abnormal Pap or HPV tests in the past 10 years.  HPV screening.** / Every 3 years from ages 65 years through ages 59 or 46 years with a history of 3 consecutive normal Pap tests. Testing can be stopped between 65 and 70 years with 3 consecutive normal Pap tests and no abnormal Pap or HPV tests in the past 10 years.  Fecal occult blood test (FOBT) of stool. / Every year beginning at age 78 years and continuing until age 78 years. You may not need to do this test if you get a colonoscopy every 10 years.  Flexible sigmoidoscopy or colonoscopy.** / Every 5 years for a flexible sigmoidoscopy or every 10 years for a colonoscopy beginning at age 83 years and continuing until age 45 years.  Hepatitis C blood test.** / For all people born from [birth year] through 1965 and any individual with known risks for hepatitis C.  Osteoporosis screening.** / A one-time screening for women ages 66 years and over and women at risk for fractures or osteoporosis.  Skin self-exam. / Monthly.  Influenza vaccine. / Every year.  Tetanus, diphtheria, and acellular pertussis (Tdap/Td) vaccine.** / 1 dose of  Td every 10 years.  Varicella vaccine.** / Consult your health care provider.  Zoster vaccine.** / 1 dose for adults aged 78 years or older.  Pneumococcal 13-valent conjugate (PCV13) vaccine.** / Consult your health care provider.  Pneumococcal polysaccharide (PPSV23) vaccine.** / 1 dose for all adults aged 38 years and older.  Meningococcal vaccine.** / Consult your health care provider.  Hepatitis A vaccine.** / Consult your health care provider.  Hepatitis B vaccine.** / Consult your health care provider.  Haemophilus influenzae type b (Hib) vaccine.** / Consult your health care provider. ** Family history and personal history of risk and conditions may change your health care provider's recommendations. Document Released: 06/08/2001 Document Revised: 08/27/2013 Document Reviewed: 09/07/2010 [facility] Patient Information 2015 [place], [place]. This information is not intended to replace advice given to you by your health care provider. Make sure you discuss any questions you have with your health care provider.

## 2014-01-26 LAB — URINALYSIS
Bilirubin Urine: NEGATIVE
Glucose, UA: NEGATIVE mg/dL
Hgb urine dipstick: NEGATIVE
KETONES UR: NEGATIVE mg/dL
Nitrite: POSITIVE — AB
PH: 7 (ref 5.0–8.0)
Protein, ur: NEGATIVE mg/dL
SPECIFIC GRAVITY, URINE: 1.021 (ref 1.005–1.030)
Urobilinogen, UA: 0.2 mg/dL (ref 0.0–1.0)

## 2014-01-27 ENCOUNTER — Encounter: Payer: Self-pay | Admitting: Family Medicine

## 2014-01-27 DIAGNOSIS — M25552 Pain in left hip: Secondary | ICD-10-CM

## 2014-01-27 DIAGNOSIS — K219 Gastro-esophageal reflux disease without esophagitis: Secondary | ICD-10-CM | POA: Insufficient documentation

## 2014-01-27 DIAGNOSIS — R1013 Epigastric pain: Secondary | ICD-10-CM | POA: Insufficient documentation

## 2014-01-27 DIAGNOSIS — N3000 Acute cystitis without hematuria: Secondary | ICD-10-CM | POA: Insufficient documentation

## 2014-01-27 DIAGNOSIS — M545 Low back pain, unspecified: Secondary | ICD-10-CM | POA: Insufficient documentation

## 2014-01-27 DIAGNOSIS — R1310 Dysphagia, unspecified: Secondary | ICD-10-CM | POA: Insufficient documentation

## 2014-01-27 DIAGNOSIS — M25511 Pain in right shoulder: Secondary | ICD-10-CM

## 2014-01-27 HISTORY — DX: Pain in left hip: M25.552

## 2014-01-27 HISTORY — DX: Pain in right shoulder: M25.511

## 2014-01-27 NOTE — Assessment & Plan Note (Signed)
Ongoing for many years has been seen by ENT and GI in past. May need new referral to both if no improvement

## 2014-01-27 NOTE — Assessment & Plan Note (Signed)
Avoid offending foods, start probiotics. Do not eat large meals in late evening and consider raising head of bed. Check H Pylori, use Ranitidine and Pantoprazole and may need referral to GI if no improvement

## 2014-01-27 NOTE — Assessment & Plan Note (Signed)
With mildly enlarged thyroid will check ultrasound of neck and thyroid studies. Encouraged small bites with sips between

## 2014-01-27 NOTE — Assessment & Plan Note (Signed)
Encouraged probiotics and check UA, c&s and ancillary testing for BV and yeast
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
## 2014-01-28 ENCOUNTER — Ambulatory Visit (HOSPITAL_BASED_OUTPATIENT_CLINIC_OR_DEPARTMENT_OTHER)
Admission: RE | Admit: 2014-01-28 | Discharge: 2014-01-28 | Disposition: A | Discharge status: Home / Self Care | Payer: BC Managed Care – PPO | Source: Ambulatory Visit | Attending: Family Medicine | Admitting: Family Medicine

## 2014-01-28 DIAGNOSIS — R059 Cough, unspecified: Secondary | ICD-10-CM

## 2014-01-28 DIAGNOSIS — R05 Cough: Secondary | ICD-10-CM | POA: Diagnosis not present

## 2014-01-28 DIAGNOSIS — R1310 Dysphagia, unspecified: Secondary | ICD-10-CM | POA: Diagnosis present

## 2014-01-28 LAB — URINE CULTURE

## 2014-01-28 LAB — H. PYLORI ANTIBODY, IGG: H Pylori IgG: 0.55 {ISR}

## 2014-01-29 ENCOUNTER — Other Ambulatory Visit: Payer: Self-pay | Admitting: Family Medicine

## 2014-01-29 DIAGNOSIS — E041 Nontoxic single thyroid nodule: Secondary | ICD-10-CM

## 2014-01-29 MED ORDER — CIPROFLOXACIN HCL 500 MG PO TABS: 500.0000 mg | ORAL_TABLET | Freq: Two times a day (BID) | ORAL | Status: DC

## 2014-01-29 NOTE — Addendum Note (Signed)
Addended by: Peggyann Shoals on: 01/29/2014 10:29 AM   Modules accepted: Orders

## 2014-01-31 ENCOUNTER — Other Ambulatory Visit (HOSPITAL_BASED_OUTPATIENT_CLINIC_OR_DEPARTMENT_OTHER): Payer: BC Managed Care – PPO

## 2014-02-04 LAB — URINE CYTOLOGY ANCILLARY ONLY
Bacterial vaginitis: POSITIVE — AB
Candida vaginitis: NEGATIVE

## 2014-02-04 MED ORDER — METRONIDAZOLE 500 MG PO TABS: 500.0000 mg | ORAL_TABLET | Freq: Two times a day (BID) | ORAL | Status: DC

## 2014-02-04 NOTE — Addendum Note (Signed)
Addended by: Varney Daily on: 02/04/2014 11:44 AM   Modules accepted: Orders

## 2014-04-11 ENCOUNTER — Ambulatory Visit (INDEPENDENT_AMBULATORY_CARE_PROVIDER_SITE_OTHER): Payer: BC Managed Care – PPO | Admitting: Medical

## 2014-04-11 ENCOUNTER — Ambulatory Visit (HOSPITAL_BASED_OUTPATIENT_CLINIC_OR_DEPARTMENT_OTHER)
Admission: RE | Admit: 2014-04-11 | Discharge: 2014-04-11 | Disposition: A | Discharge status: Home / Self Care | Payer: BC Managed Care – PPO | Source: Ambulatory Visit | Attending: Medical | Admitting: Medical

## 2014-04-11 ENCOUNTER — Encounter: Payer: Self-pay | Admitting: Medical

## 2014-04-11 VITALS — BP 96/66 | HR 79 | Temp 98.3°F | Ht 67.0 in | Wt 141.6 lb

## 2014-04-11 DIAGNOSIS — R059 Cough, unspecified: Secondary | ICD-10-CM

## 2014-04-11 DIAGNOSIS — R509 Fever, unspecified: Secondary | ICD-10-CM | POA: Insufficient documentation

## 2014-04-11 DIAGNOSIS — J322 Chronic ethmoidal sinusitis: Secondary | ICD-10-CM | POA: Insufficient documentation

## 2014-04-11 DIAGNOSIS — J0100 Acute maxillary sinusitis, unspecified: Secondary | ICD-10-CM

## 2014-04-11 DIAGNOSIS — R05 Cough: Secondary | ICD-10-CM | POA: Diagnosis not present

## 2014-04-11 MED ORDER — MONTELUKAST SODIUM 10 MG PO TABS: 10.0000 mg | ORAL_TABLET | Freq: Every day | ORAL | Status: DC

## 2014-04-11 MED ORDER — FLUTICASONE PROPIONATE 50 MCG/ACT NA SUSP: 2.0000 | Freq: Every day | NASAL | Status: DC

## 2014-04-11 MED ORDER — CLARITHROMYCIN ER 500 MG PO TB24: 1000.0000 mg | ORAL_TABLET | Freq: Every day | ORAL | Status: DC

## 2014-04-11 MED ORDER — FLUCONAZOLE 150 MG PO TABS: 150.0000 mg | ORAL_TABLET | Freq: Once | ORAL | Status: DC

## 2014-04-11 MED ORDER — HYDROCOD POLST-CHLORPHEN POLST 10-8 MG/5ML PO LQCR: 5.0000 mL | Freq: Two times a day (BID) | ORAL | Status: DC | PRN

## 2014-04-11 NOTE — Progress Notes (Signed)
Pre visit review using our clinic review tool, if applicable. No additional management support is needed unless otherwise documented below in the visit note. 

## 2014-04-11 NOTE — Assessment & Plan Note (Signed)
Your appear to have a sinus infection and bronchtis(seem both secondary to allergies). I am prescribing biaxin xl antibiotic for the infection. To help with the nasal congestion I prescribed flonase  nasal steroid. For your associated cough, I prescribed cough medicine tussionex.  Please get cxr.  For the possible underlying allergies I will also precrribe singulair.

## 2014-04-11 NOTE — Patient Instructions (Signed)
Your appear to have a sinus infection and bronchtis(seem both secondary to allergies). I am prescribing biaxin xl antibiotic for the infection. To help with the nasal congestion I prescribed flonase  nasal steroid. For your associated cough, I prescribed cough medicine tussionex.  Please get cxr.  For the possible underlying allergies I will also precrribe singulair.   Rx of diflucan  Rest, hydrate, tylenol for fever.  Follow up in 7 days or as needed.
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
## 2014-04-12 ENCOUNTER — Telehealth: Payer: Self-pay

## 2014-04-12 NOTE — Telephone Encounter (Signed)
-----   Message from Irven Baltimore sent at 04/12/2014  3:46 PM EST ----- Patient calling back regarding Xray results. She wants to know what hyperinflation is. Best # [phone]

## 2014-04-12 NOTE — Telephone Encounter (Signed)
Called patient back and explained to her that Hyperinflation more than likely took place when she took a deep breath right before her XR.

## 2014-05-27 ENCOUNTER — Ambulatory Visit: Payer: BC Managed Care – PPO | Admitting: Family Medicine

## 2014-06-03 ENCOUNTER — Other Ambulatory Visit: Payer: Self-pay | Admitting: Obstetrics and Gynecology

## 2014-06-03 ENCOUNTER — Ambulatory Visit (INDEPENDENT_AMBULATORY_CARE_PROVIDER_SITE_OTHER): Payer: 59 | Admitting: Family Medicine

## 2014-06-03 ENCOUNTER — Encounter: Payer: Self-pay | Admitting: Family Medicine

## 2014-06-03 VITALS — BP 102/62 | HR 76 | Temp 98.0°F | Ht 67.0 in | Wt 143.4 lb

## 2014-06-03 DIAGNOSIS — K219 Gastro-esophageal reflux disease without esophagitis: Secondary | ICD-10-CM

## 2014-06-03 DIAGNOSIS — R1310 Dysphagia, unspecified: Secondary | ICD-10-CM

## 2014-06-03 DIAGNOSIS — R059 Cough, unspecified: Secondary | ICD-10-CM

## 2014-06-03 DIAGNOSIS — J0100 Acute maxillary sinusitis, unspecified: Secondary | ICD-10-CM

## 2014-06-03 DIAGNOSIS — R1013 Epigastric pain: Secondary | ICD-10-CM

## 2014-06-03 DIAGNOSIS — R05 Cough: Secondary | ICD-10-CM

## 2014-06-03 DIAGNOSIS — T7840XD Allergy, unspecified, subsequent encounter: Secondary | ICD-10-CM

## 2014-06-03 MED ORDER — CETIRIZINE HCL 10 MG PO TABS: 10.0000 mg | ORAL_TABLET | Freq: Every day | ORAL | Status: DC

## 2014-06-03 MED ORDER — MONTELUKAST SODIUM 10 MG PO TABS: 10.0000 mg | ORAL_TABLET | Freq: Every day | ORAL | Status: DC

## 2014-06-03 NOTE — Patient Instructions (Signed)
Daily probiotic such as [employer], [employer], Probiotics by [place] can order at [email or web address]    Allergies Allergies may happen from anything your body is sensitive to. This may be food, medicines, pollens, chemicals, and nearly anything around you in everyday life that produces allergens. An allergen is anything that causes an allergy producing substance. Heredity is often a factor in causing these problems. This means you may have some of the same allergies as your parents. Food allergies happen in all age groups. Food allergies are some of the most severe and life threatening. Some common food allergies are cow's milk, seafood, eggs, nuts, wheat, and soybeans. SYMPTOMS   Swelling around the mouth.  An itchy red rash or hives.  Vomiting or diarrhea.  Difficulty breathing. SEVERE ALLERGIC REACTIONS ARE LIFE-THREATENING. This reaction is called anaphylaxis. It can cause the mouth and throat to swell and cause difficulty with breathing and swallowing. In severe reactions only a trace amount of food (for example, peanut oil in a salad) may cause death within seconds. Seasonal allergies occur in all age groups. These are seasonal because they usually occur during the same season every year. They may be a reaction to molds, grass pollens, or tree pollens. Other causes of problems are house dust mite allergens, pet dander, and mold spores. The symptoms often consist of nasal congestion, a runny itchy nose associated with sneezing, and tearing itchy eyes. There is often an associated itching of the mouth and ears. The problems happen when you come in contact with pollens and other allergens. Allergens are the particles in the air that the body reacts to with an allergic reaction. This causes you to release allergic antibodies. Through a chain of events, these eventually cause you to release histamine into the blood stream. Although it is meant to be protective to the body,  it is this release that causes your discomfort. This is why you were given anti-histamines to feel better. If you are unable to pinpoint the offending allergen, it may be determined by skin or blood testing. Allergies cannot be cured but can be controlled with medicine. Hay fever is a collection of all or some of the seasonal allergy problems. It may often be treated with simple over-the-counter medicine such as diphenhydramine. Take medicine as directed. Do not drink alcohol or drive while taking this medicine. Check with your caregiver or package insert for child dosages. If these medicines are not effective, there are many new medicines your caregiver can prescribe. Stronger medicine such as nasal spray, eye drops, and corticosteroids may be used if the first things you try do not work well. Other treatments such as immunotherapy or desensitizing injections can be used if all else fails. Follow up with your caregiver if problems continue. These seasonal allergies are usually not life threatening. They are generally more of a nuisance that can often be handled using medicine. HOME CARE INSTRUCTIONS   If unsure what causes a reaction, keep a diary of foods eaten and symptoms that follow. Avoid foods that cause reactions.  If hives or rash are present:  Take medicine as directed.  You may use an over-the-counter antihistamine (diphenhydramine) for hives and itching as needed.  Apply cold compresses (cloths) to the skin or take baths in cool water. Avoid hot baths or showers. Heat will make a rash and itching worse.  If you are severely allergic:  Following a treatment for a severe reaction, hospitalization is often required for closer follow-up.  Wear a  medic-alert bracelet or necklace stating the allergy.  You and your family must learn how to give adrenaline or use an anaphylaxis kit.  If you have had a severe reaction, always carry your anaphylaxis kit or EpiPen with you. Use this  medicine as directed by your caregiver if a severe reaction is occurring. Failure to do so could have a fatal outcome. SEEK MEDICAL CARE IF:  You suspect a food allergy. Symptoms generally happen within 30 minutes of eating a food.  Your symptoms have not gone away within 2 days or are getting worse.  You develop new symptoms.  You want to retest yourself or your child with a food or drink you think causes an allergic reaction. Never do this if an anaphylactic reaction to that food or drink has happened before. Only do this under the care of a caregiver. SEEK IMMEDIATE MEDICAL CARE IF:   You have difficulty breathing, are wheezing, or have a tight feeling in your chest or throat.  You have a swollen mouth, or you have hives, swelling, or itching all over your body.  You have had a severe reaction that has responded to your anaphylaxis kit or an EpiPen. These reactions may return when the medicine has worn off. These reactions should be considered life threatening. MAKE SURE YOU:   Understand these instructions.  Will watch your condition.  Will get help right away if you are not doing well or get worse. Document Released: 07/06/2002 Document Revised: 08/07/2012 Document Reviewed: 12/11/2007 [facility] Patient Information 2015 [place], [place]. This information is not intended to replace advice given to you by your health care provider. Make sure you discuss any questions you have with your health care provider.

## 2014-06-03 NOTE — Progress Notes (Signed)
Pre visit review using our clinic review tool, if applicable. No additional management support is needed unless otherwise documented below in the visit note. 

## 2014-06-04 LAB — CYTOLOGY - PAP

## 2014-06-07 LAB — IGG FOOD PANEL
ALLERGEN EGG WHITE IGG: 4.7 ug/mL — AB (ref <2.0)
ALLERGEN, MILK, IGG: 16.1 ug/mL — AB (ref <0.15)
Beef, IgG: 6.9 ug/mL — ABNORMAL HIGH (ref <2.0)
Chicken, IgG: <0.15 ug/mL (ref <0.15)
Egg yolk, IgG: 7.1 ug/mL — ABNORMAL HIGH (ref <2.0)
Peanut, IgG: <0.15 ug/mL (ref <0.15)
Wheat, IgG: 0.58 ug/mL — ABNORMAL HIGH (ref <0.15)

## 2014-06-10 ENCOUNTER — Encounter: Payer: Self-pay | Admitting: Family Medicine

## 2014-06-10 DIAGNOSIS — T7840XA Allergy, unspecified, initial encounter: Secondary | ICD-10-CM

## 2014-06-10 HISTORY — DX: Allergy, unspecified, initial encounter: T78.40XA

## 2014-06-10 NOTE — Assessment & Plan Note (Signed)
Avoid offending foods, take probiotics. Do not eat large meals in late evening and consider raising head of bed. Improving with protonix and ranitidine.
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
## 2014-06-10 NOTE — Assessment & Plan Note (Signed)
Continue Zyrtec and flonase, add nasal saline, Singulair is a good addition

## 2014-06-10 NOTE — Assessment & Plan Note (Signed)
No new symptoms.

## 2014-06-10 NOTE — Assessment & Plan Note (Signed)
Improving, encouraged antihistamines, adequate hydration, continue flonase daily, nasal saline bid

## 2014-08-22 ENCOUNTER — Ambulatory Visit (INDEPENDENT_AMBULATORY_CARE_PROVIDER_SITE_OTHER): Payer: 59 | Admitting: Family Medicine

## 2014-08-22 ENCOUNTER — Encounter: Payer: Self-pay | Admitting: Family Medicine

## 2014-08-22 VITALS — BP 96/60 | HR 74 | Temp 98.4°F | Resp 16 | Ht 67.0 in | Wt 134.0 lb

## 2014-08-22 DIAGNOSIS — Z8639 Personal history of other endocrine, nutritional and metabolic disease: Secondary | ICD-10-CM

## 2014-08-22 DIAGNOSIS — R432 Parageusia: Secondary | ICD-10-CM

## 2014-08-22 LAB — BASIC METABOLIC PANEL
BUN: 8 mg/dL (ref 6–23)
CO2: 31 mEq/L (ref 19–32)
CREATININE: 0.55 mg/dL (ref 0.40–1.20)
Calcium: 9.3 mg/dL (ref 8.4–10.5)
Chloride: 102 mEq/L (ref 96–112)
GFR: 127.9 mL/min (ref >60.00)
Glucose, Bld: 93 mg/dL (ref 70–99)
Potassium: 3.5 mEq/L (ref 3.5–5.1)
SODIUM: 137 meq/L (ref 135–145)

## 2014-08-22 LAB — CBC WITH DIFFERENTIAL/PLATELET
Basophils Absolute: 0 10*3/uL (ref 0.0–0.1)
Basophils Relative: 0.5 % (ref 0.0–3.0)
EOS ABS: 0.1 10*3/uL (ref 0.0–0.7)
Eosinophils Relative: 3.4 % (ref 0.0–5.0)
HEMATOCRIT: 40 % (ref 36.0–46.0)
HEMOGLOBIN: 13.6 g/dL (ref 12.0–15.0)
Lymphocytes Relative: 32.2 % (ref 12.0–46.0)
Lymphs Abs: 1.3 10*3/uL (ref 0.7–4.0)
MCHC: 34 g/dL (ref 30.0–36.0)
MCV: 87.9 fl (ref 78.0–100.0)
Monocytes Absolute: 0.4 10*3/uL (ref 0.1–1.0)
Monocytes Relative: 8.9 % (ref 3.0–12.0)
NEUTROS ABS: 2.2 10*3/uL (ref 1.4–7.7)
Neutrophils Relative %: 55 % (ref 43.0–77.0)
Platelets: 305 10*3/uL (ref 150.0–400.0)
RBC: 4.55 Mil/uL (ref 3.87–5.11)
RDW: 12.5 % (ref 11.5–15.5)
WBC: 4.1 10*3/uL (ref 4.0–10.5)

## 2014-08-22 LAB — LIPID PANEL
CHOL/HDL RATIO: 2
Cholesterol: 125 mg/dL (ref 0–200)
HDL: 51.2 mg/dL (ref >39.00)
LDL CALC: 62 mg/dL (ref 0–99)
NONHDL: 73.8
TRIGLYCERIDES: 58 mg/dL (ref 0.0–149.0)
VLDL: 11.6 mg/dL (ref 0.0–40.0)

## 2014-08-22 LAB — HEMOGLOBIN A1C: Hgb A1c MFr Bld: 5.6 % (ref 4.6–6.5)

## 2014-08-22 LAB — HEPATIC FUNCTION PANEL
ALBUMIN: 4.5 g/dL (ref 3.5–5.2)
ALK PHOS: 51 U/L (ref 39–117)
ALT: 29 U/L (ref 0–35)
AST: 22 U/L (ref 0–37)
Bilirubin, Direct: 0.1 mg/dL (ref 0.0–0.3)
Total Bilirubin: 0.3 mg/dL (ref 0.2–1.2)
Total Protein: 7 g/dL (ref 6.0–8.3)

## 2014-08-22 LAB — TSH: TSH: 0.96 u[IU]/mL (ref 0.35–4.50)

## 2014-08-22 NOTE — Progress Notes (Signed)
Subjective:    Patient ID: Sherry Allen, female    DOB: September 30, 1970, 44 y.o.   MRN: [id number]  HPI  Patient here for abnormal taste in mouth and inc thirst.  She had a hx of elevated glucose in past.  Pt tastes salt with everything she eats even with water.  Past Medical History  Diagnosis Date  . GERD (gastroesophageal reflux disease)   . Pelvic fracture 43 yrs old  . Chicken pox as a child  . Cough 01/25/2014  . Left hip pain 01/27/2014  . Right shoulder pain 01/27/2014  . Allergic state 06/10/2014    Review of Systems  Constitutional: Negative for activity change, appetite change, fatigue and unexpected weight change.  Respiratory: Negative for cough and shortness of breath.   Cardiovascular: Negative for chest pain and palpitations.  Psychiatric/Behavioral: Negative for behavioral problems and dysphoric mood. The patient is not nervous/anxious.     Current Outpatient Prescriptions on File Prior to Visit  Medication Sig Dispense Refill  . cetirizine (ZYRTEC) 10 MG tablet Take 1 tablet (10 mg total) by mouth daily. 30 tablet 11  . fluticasone (FLONASE) 50 MCG/ACT nasal spray Place 2 sprays into both nostrils daily. 16 g 1  . montelukast (SINGULAIR) 10 MG tablet Take 1 tablet (10 mg total) by mouth at bedtime. 90 tablet 3  . pantoprazole (PROTONIX) 40 MG tablet Take 1 tablet (40 mg total) by mouth daily. 30 tablet 4  . ranitidine (ZANTAC) 300 MG tablet Take 1 tablet (300 mg total) by mouth at bedtime. 30 tablet 4   No current facility-administered medications on file prior to visit.       Objective:    Physical Exam  Constitutional: She is oriented to person, place, and time. She appears well-developed and well-nourished. No distress.  HENT:  Right Ear: External ear normal.  Left Ear: External ear normal.  Nose: Nose normal.  Mouth/Throat: Oropharynx is clear and moist.  Eyes: EOM are normal. Pupils are equal, round, and reactive to light.  Neck: Normal range of  motion. Neck supple.  Cardiovascular: Normal rate, regular rhythm and normal heart sounds.   No murmur heard. Pulmonary/Chest: Effort normal and breath sounds normal. No respiratory distress. She has no wheezes. She has no rales. She exhibits no tenderness.  Neurological: She is alert and oriented to person, place, and time.  Psychiatric: She has a normal mood and affect. Her behavior is normal. Judgment and thought content normal.    BP 96/60 mmHg  Pulse 74  Temp(Src) 98.4 F (36.9 C) (Oral)  Resp 16  Ht 5\' 7"  (1.702 m)  Wt 134 lb (60.782 kg)  BMI 20.98 kg/m2  SpO2 97% Wt Readings from Last 3 Encounters:  08/22/14 134 lb (60.782 kg)  06/03/14 143 lb 6 oz (65.034 kg)  04/11/14 141 lb 9.6 oz (64.229 kg)     Lab Results  Component Value Date   WBC 6.0 01/25/2014   HGB 13.7 01/25/2014   HCT 40.6 01/25/2014   PLT 309 01/25/2014   GLUCOSE 119* 01/31/2009   ALT 21 01/31/2009   AST 26 01/31/2009   NA 137 01/31/2009   K 3.4* 01/31/2009   CL 106 01/31/2009   CREATININE 0.54 01/31/2009   BUN 5* 01/31/2009   CO2 25 01/31/2009   TSH 1.107 01/25/2014       Assessment & Plan:   Problem List Items Addressed This Visit    None    Visit Diagnoses    Abnormal taste  in mouth    -  Primary    Relevant Orders    Basic Metabolic Panel (BMET)    CBC with Differential    Hepatic function panel    TSH    History of hyperglycemia        Relevant Orders    Lipid Profile    HgB A1c    POCT Urinalysis Dipstick       I am having Ms. Bohle maintain her pantoprazole, ranitidine, fluticasone, cetirizine, and montelukast.  No orders of the defined types were placed in this encounter.     Garnet Koyanagi, DO0

## 2014-08-22 NOTE — Progress Notes (Signed)
Pre visit review using our clinic review tool, if applicable. No additional management support is needed unless otherwise documented below in the visit note. 

## 2014-08-22 NOTE — Patient Instructions (Signed)

## 2014-08-27 ENCOUNTER — Other Ambulatory Visit: Payer: Self-pay | Admitting: Family Medicine

## 2014-08-27 ENCOUNTER — Telehealth: Payer: Self-pay | Admitting: Family Medicine

## 2014-08-27 DIAGNOSIS — R432 Parageusia: Secondary | ICD-10-CM

## 2014-08-27 DIAGNOSIS — K2100 Gastro-esophageal reflux disease with esophagitis, without bleeding: Secondary | ICD-10-CM

## 2014-08-27 NOTE — Telephone Encounter (Signed)
Let them know there have been computer issues and I'm behind---

## 2014-08-27 NOTE — Telephone Encounter (Signed)
Caller name: Khaleah Relation to pt: self Call back number:  [phone] or [phone] Pharmacy:  Reason for call:   Requesting lab results from last week.

## 2014-08-27 NOTE — Telephone Encounter (Signed)
Labs have not bee resulted. Please advise     KP

## 2014-08-27 NOTE — Telephone Encounter (Signed)
Discussed with patient and she [place] still having her symptoms, Labs are WNL so Dr.Lowe advised GI referral. The patient agreed to the referral and it has been placed.      KP

## 2014-08-28 ENCOUNTER — Encounter: Payer: Self-pay | Admitting: Internal Medicine

## 2014-09-17 ENCOUNTER — Ambulatory Visit: Payer: 59 | Admitting: Family Medicine

## 2014-10-03 ENCOUNTER — Ambulatory Visit: Payer: 59 | Admitting: Family Medicine

## 2014-10-21 ENCOUNTER — Ambulatory Visit: Payer: 59 | Admitting: Internal Medicine

## 2014-10-31 ENCOUNTER — Other Ambulatory Visit: Payer: Self-pay | Admitting: Family Medicine

## 2014-11-04 ENCOUNTER — Encounter: Payer: Self-pay | Admitting: Family Medicine

## 2014-11-04 ENCOUNTER — Ambulatory Visit (INDEPENDENT_AMBULATORY_CARE_PROVIDER_SITE_OTHER): Payer: 59 | Admitting: Family Medicine

## 2014-11-04 VITALS — BP 110/64 | HR 73 | Temp 98.2°F | Ht 67.0 in | Wt 142.2 lb

## 2014-11-04 DIAGNOSIS — R05 Cough: Secondary | ICD-10-CM | POA: Diagnosis not present

## 2014-11-04 DIAGNOSIS — R059 Cough, unspecified: Secondary | ICD-10-CM

## 2014-11-04 DIAGNOSIS — R252 Cramp and spasm: Secondary | ICD-10-CM

## 2014-11-04 DIAGNOSIS — K219 Gastro-esophageal reflux disease without esophagitis: Secondary | ICD-10-CM | POA: Diagnosis not present

## 2014-11-04 DIAGNOSIS — R1310 Dysphagia, unspecified: Secondary | ICD-10-CM

## 2014-11-04 DIAGNOSIS — T7840XD Allergy, unspecified, subsequent encounter: Secondary | ICD-10-CM

## 2014-11-04 MED ORDER — RANITIDINE HCL 300 MG PO TABS: 300.0000 mg | ORAL_TABLET | Freq: Every day | ORAL | Status: DC

## 2014-11-04 MED ORDER — BENZONATATE 100 MG PO CAPS: 100.0000 mg | ORAL_CAPSULE | Freq: Two times a day (BID) | ORAL | Status: DC | PRN

## 2014-11-04 NOTE — Patient Instructions (Addendum)
Consider a probiotic such as [place] for Gastroesophageal Reflux Disease When you have gastroesophageal reflux disease (GERD), the foods you eat and your eating habits are very important. Choosing the right foods can help ease your discomfort.  WHAT GUIDELINES DO I NEED TO FOLLOW?   Choose fruits, vegetables, whole grains, and low-fat dairy products.   Choose low-fat meat, fish, and poultry.  Limit fats such as oils, salad dressings, butter, nuts, and avocado.   Keep a food diary. This helps you identify foods that cause symptoms.   Avoid foods that cause symptoms. These may be different for everyone.   Eat small meals often instead of 3 large meals a day.   Eat your meals slowly, in a place where you are relaxed.   Limit fried foods.   Cook foods using methods other than frying.   Avoid drinking alcohol.   Avoid drinking large amounts of liquids with your meals.   Avoid bending over or lying down until 2-3 hours after eating.  WHAT FOODS ARE NOT RECOMMENDED?  These are some foods and drinks that may make your symptoms worse: Vegetables Tomatoes. Tomato juice. Tomato and spaghetti sauce. Chili peppers. Onion and garlic. Horseradish. Fruits Oranges, grapefruit, and lemon (fruit and juice). Meats High-fat meats, fish, and poultry. This includes hot dogs, ribs, ham, sausage, salami, and bacon. Dairy Whole milk and chocolate milk. Sour cream. Cream. Butter. Ice cream. Cream cheese.  Drinks Coffee and tea. Bubbly (carbonated) drinks or energy drinks. Condiments Hot sauce. Barbecue sauce.  Sweets/Desserts Chocolate and cocoa. Donuts. Peppermint and spearmint. Fats and Oils High-fat foods. This includes [place] fries and potato chips. Other Vinegar. Strong spices. This includes black pepper, white pepper, red pepper, cayenne, curry powder, cloves, ginger, and chili powder. The items listed above may not be a complete list of  foods and drinks to avoid. Contact your dietitian for more information. Document Released: 10/12/2011 Document Revised: 04/17/2013 Document Reviewed: 02/14/2013 [facility] Patient Information 2015 [place], [place]. This information is not intended to replace advice given to you by your health care provider. Make sure you discuss any questions you have with your health care provider.
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
## 2014-11-05 LAB — COMPREHENSIVE METABOLIC PANEL
ALBUMIN: 4.2 g/dL (ref 3.5–5.2)
ALK PHOS: 41 U/L (ref 39–117)
ALT: 20 U/L (ref 0–35)
AST: 18 U/L (ref 0–37)
BUN: 13 mg/dL (ref 6–23)
CO2: 30 meq/L (ref 19–32)
Calcium: 9.2 mg/dL (ref 8.4–10.5)
Chloride: 103 mEq/L (ref 96–112)
Creatinine, Ser: 1.02 mg/dL (ref 0.40–1.20)
GFR: 62.65 mL/min (ref >60.00)
Glucose, Bld: 75 mg/dL (ref 70–99)
Potassium: 4 mEq/L (ref 3.5–5.1)
Sodium: 139 mEq/L (ref 135–145)
Total Bilirubin: 0.4 mg/dL (ref 0.2–1.2)
Total Protein: 7.1 g/dL (ref 6.0–8.3)

## 2014-11-05 LAB — MAGNESIUM: Magnesium: 2 mg/dL (ref 1.5–2.5)

## 2014-11-06 ENCOUNTER — Telehealth: Payer: Self-pay

## 2014-11-06 LAB — ZINC: ZINC: 67 ug/dL (ref 60–130)

## 2014-11-06 NOTE — Telephone Encounter (Signed)
-----   Message from Mosie Lukes, MD sent at 11/05/2014 11:04 PM EDT ----- Magnesium normal

## 2014-11-06 NOTE — Telephone Encounter (Signed)
Pt notified verbalized understanding. No questions or concerns at this time 

## 2014-11-17 DIAGNOSIS — R252 Cramp and spasm: Secondary | ICD-10-CM | POA: Insufficient documentation

## 2014-11-17 NOTE — Assessment & Plan Note (Signed)
Likely mutifactorial, slightly worsened. Encouraged increased rest and hydration, add probiotics, zinc such as Coldeze or Xicam. Treat fevers as needed. Take elderberry prn for throat irritation

## 2014-11-17 NOTE — Assessment & Plan Note (Signed)
Avoid offending foods, start probiotics. Do not eat large meals in late evening and consider raising head of bed. May need referral to gastroenterology if no improvement

## 2014-11-17 NOTE — Assessment & Plan Note (Signed)
Increase hydration and Hyland's leg cramp med and magnesium daily

## 2014-11-17 NOTE — Assessment & Plan Note (Signed)
Zyrtec, Singulair, Flonase prn

## 2014-12-24 ENCOUNTER — Ambulatory Visit (INDEPENDENT_AMBULATORY_CARE_PROVIDER_SITE_OTHER): Payer: 59 | Admitting: Internal Medicine

## 2014-12-24 ENCOUNTER — Encounter: Payer: Self-pay | Admitting: Internal Medicine

## 2014-12-24 VITALS — BP 102/68 | HR 60 | Ht 67.0 in | Wt 142.4 lb

## 2014-12-24 DIAGNOSIS — R059 Cough, unspecified: Secondary | ICD-10-CM

## 2014-12-24 DIAGNOSIS — R6889 Other general symptoms and signs: Secondary | ICD-10-CM

## 2014-12-24 DIAGNOSIS — K219 Gastro-esophageal reflux disease without esophagitis: Secondary | ICD-10-CM

## 2014-12-24 DIAGNOSIS — IMO0001 Reserved for inherently not codable concepts without codable children: Secondary | ICD-10-CM

## 2014-12-24 DIAGNOSIS — R1110 Vomiting, unspecified: Secondary | ICD-10-CM

## 2014-12-24 DIAGNOSIS — R198 Other specified symptoms and signs involving the digestive system and abdomen: Secondary | ICD-10-CM | POA: Diagnosis not present

## 2014-12-24 DIAGNOSIS — R05 Cough: Secondary | ICD-10-CM | POA: Diagnosis not present

## 2014-12-24 DIAGNOSIS — R112 Nausea with vomiting, unspecified: Secondary | ICD-10-CM

## 2014-12-24 DIAGNOSIS — R0989 Other specified symptoms and signs involving the circulatory and respiratory systems: Secondary | ICD-10-CM

## 2014-12-24 NOTE — Addendum Note (Signed)
Addended by: Larina Bras on: 12/24/2014 04:40 PM   Modules accepted: Orders
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
## 2014-12-24 NOTE — Patient Instructions (Signed)
You have been scheduled for a 24 Hour PH Probe test with impedence study at [facility] on 01/27/15 at 12:30 pm. Please arrive 30 minutes prior to your procedure for registration. You will need to go to outpatient registration (1st floor of the hospital) first. Make certain to bring your insurance cards as well as a complete list of medications.  Please remember the following:  1) Nothing to eat or drink after 12:00 midnight on the night before your test.  2) Hold all diabetic medications/insulin the morning of the test. You may eat and take your medications after the test.  It will take at least 2 weeks to receive the results of this test from your physician. ------------------------------------------ ABOUT 24 HOUR [place] PROBE An esophageal pH test measures and records the pH in your esophagus to determine if you have gastroesophageal reflux disease (GERD). The test can also be done to determine the effectiveness of medications or surgical treatment for GERD. What is esophageal reflux? Esophageal reflux is a condition in which stomach acid refluxes or moves back into the esophagus (the "food pipe" leading from the mouth to the stomach). How does the esophageal pH test work? A thin, small tube with an acid sensing device on the tip is gently passed through your nose, down the esophagus ("food tube"), and positioned about 2 inches above the lower esophageal sphincter. The tube is secured to the side of your face with clear tape. The end of the tube exiting from your nose is attached to a portable recorder that is worn on your belt or over your shoulder. The recorder has several buttons on it that you will press to mark certain events. A nurse will review the monitoring instructions with you. Once the test has begun, what do I need to know and do? Activity: Follow your usual daily routine. Do not reduce or change your activities during the monitoring period. Doing so can make the monitoring  results less useful.  Note: do not take a tub bath or shower; the equipment can't get wet.  Eating: Eat your regular meals at the usual times. If you do not eat during the monitoring period, your stomach will not produce acid as usual, and the test results will not be accurate. Eat at least 2 meals a day. Eat foods that tend to increase your symptoms (without making yourself miserable). Avoid snacking. Do not suck on hard candy or lozenges and do not chew gum during the monitoring period.  Lying down: Remain upright throughout the day. Do not lie down until you go to bed (unless napping or lying down during the day is part of your daily routine).  Medications: Continue to follow your doctor's advice regarding medications to avoid during the monitoring period.  Recording symptoms: Press the appropriate button on your recorder when symptoms occur (as discussed with the nurse).  Recording events: Record the time you start and stop eating and drinking (anything other than plain water). Record the time you lie down (even if just resting) and when you get back up. The nurse will explain this.  Unusual symptoms or side effects. If you think you may be experiencing any unusual symptoms or side effects, call your doctor.  You will return the next day to have the tube removed. The information on the recorder will be downloaded to a computer and the results will be analyzed.  After completion of the study Resume your normal diet and medications. Lozenges or hard candy may help ease  any sore throat caused by the tube.

## 2015-01-29 ENCOUNTER — Telehealth: Payer: Self-pay | Admitting: Internal Medicine

## 2015-01-29 NOTE — Telephone Encounter (Signed)
No other recommendations at this time The question is reflux and relation to cough, which 24hr ph and impedence is the best test to answer this question Has had prior workup and medication trials I understand financial constraints, she can reschedule with financially feasible

## 2015-01-29 NOTE — Telephone Encounter (Signed)
Pt scheduled for 24 hour ph probe on Monday. Pt states she cannot afford this right now and wants to cancel. Pt wants to know if Dr. Hilarie Fredrickson has any other recommendations. Please advise.

## 2015-01-29 NOTE — Telephone Encounter (Signed)
Left message for pt to call back.  Spoke with pt and she is aware. 

## 2015-02-03 ENCOUNTER — Ambulatory Visit (HOSPITAL_COMMUNITY): Admission: RE | Admit: 2015-02-03 | Payer: 59 | Source: Ambulatory Visit | Admitting: Internal Medicine

## 2015-02-03 ENCOUNTER — Encounter: Payer: Self-pay | Admitting: Family Medicine

## 2015-02-03 ENCOUNTER — Ambulatory Visit (INDEPENDENT_AMBULATORY_CARE_PROVIDER_SITE_OTHER): Payer: 59 | Admitting: Family Medicine

## 2015-02-03 ENCOUNTER — Encounter (HOSPITAL_COMMUNITY): Admission: RE | Payer: Self-pay | Source: Ambulatory Visit

## 2015-02-03 VITALS — BP 92/62 | HR 68 | Temp 98.1°F | Ht 67.0 in | Wt 145.1 lb

## 2015-02-03 DIAGNOSIS — R05 Cough: Secondary | ICD-10-CM

## 2015-02-03 DIAGNOSIS — R198 Other specified symptoms and signs involving the digestive system and abdomen: Secondary | ICD-10-CM

## 2015-02-03 DIAGNOSIS — K219 Gastro-esophageal reflux disease without esophagitis: Secondary | ICD-10-CM

## 2015-02-03 DIAGNOSIS — T7840XD Allergy, unspecified, subsequent encounter: Secondary | ICD-10-CM | POA: Diagnosis not present

## 2015-02-03 DIAGNOSIS — L989 Disorder of the skin and subcutaneous tissue, unspecified: Secondary | ICD-10-CM

## 2015-02-03 DIAGNOSIS — T781XXA Other adverse food reactions, not elsewhere classified, initial encounter: Secondary | ICD-10-CM

## 2015-02-03 DIAGNOSIS — R1013 Epigastric pain: Secondary | ICD-10-CM

## 2015-02-03 DIAGNOSIS — R059 Cough, unspecified: Secondary | ICD-10-CM

## 2015-02-03 DIAGNOSIS — K5229 Other allergic and dietetic gastroenteritis and colitis: Secondary | ICD-10-CM

## 2015-02-03 HISTORY — DX: Other adverse food reactions, not elsewhere classified, initial encounter: T78.1XXA

## 2015-02-03 SURGERY — MONITORING, ESOPHAGEAL PH, 24 HOUR

## 2015-02-03 MED ORDER — DEXLANSOPRAZOLE 60 MG PO CPDR: 60.0000 mg | DELAYED_RELEASE_CAPSULE | Freq: Every day | ORAL | Status: DC

## 2015-02-03 NOTE — Assessment & Plan Note (Signed)
She notes it has been present for several years but is enlarging. Sent to [facility] where family is seen

## 2015-02-03 NOTE — Patient Instructions (Signed)
Increase Zyrtec to 2 x daily, add Mucinex at bed, minimize foods that are sensitive. Increase hydration to 64 oz daily.   Allergies An allergy is an abnormal reaction to a substance by the body's defense system (immune system). Allergies can develop at any age. WHAT CAUSES ALLERGIES? An allergic reaction happens when the immune system mistakenly reacts to a normally harmless substance, called an allergen, as if it were harmful. The immune system releases antibodies to fight the substance. Antibodies eventually release a chemical called histamine into the bloodstream. The release of histamine is meant to protect the body from infection, but it also causes discomfort. An allergic reaction can be triggered by:  Eating an allergen.  Inhaling an allergen.  Touching an allergen. WHAT TYPES OF ALLERGIES ARE THERE? There are many types of allergies. Common types include:  Seasonal allergies. People with this type of allergy are usually allergic to substances that are only present during certain seasons, such as molds and pollens.  Food allergies.  Drug allergies.  Insect allergies.  Animal dander allergies. WHAT ARE SYMPTOMS OF ALLERGIES? Possible allergy symptoms include:  Swelling of the lips, face, tongue, mouth, or throat.  Sneezing, coughing, or wheezing.  Nasal congestion.  Tingling in the mouth.  Rash.  Itching.  Itchy, red, swollen areas of skin (hives).  Watery eyes.  Vomiting.  Diarrhea.  Dizziness.  Lightheadedness.  Fainting.  Trouble breathing or swallowing.  Chest tightness.  Rapid heartbeat. HOW ARE ALLERGIES DIAGNOSED? Allergies are diagnosed with a medical and family history and one or more of the following:  Skin tests.  Blood tests.  A food diary. A food diary is a record of all the foods and drinks you have in a day and of all the symptoms you experience.  The results of an elimination diet. An elimination diet involves eliminating  foods from your diet and then adding them back in one by one to find out if a certain food causes an allergic reaction. HOW ARE ALLERGIES TREATED? There is no cure for allergies, but allergic reactions can be treated with medicine. Severe reactions usually need to be treated at a hospital. HOW CAN REACTIONS BE PREVENTED? The best way to prevent an allergic reaction is by avoiding the substance you are allergic to. Allergy shots and medicines can also help prevent reactions in some cases. People with severe allergic reactions may be able to prevent a life-threatening reaction called anaphylaxis with a medicine given right after exposure to the allergen.   This information is not intended to replace advice given to you by your health care provider. Make sure you discuss any questions you have with your health care provider.   Document Released: 07/06/2002 Document Revised: 05/03/2014 Document Reviewed: 01/22/2014 Elsevier Interactive Patient Education [employer].

## 2015-02-03 NOTE — Assessment & Plan Note (Signed)
IgE testins shows sensitivity to egg, meat, milk. Has been avoiding red meat and decreases noted in others. Since avoiding milk qhs night cough improved some.

## 2015-02-03 NOTE — Assessment & Plan Note (Addendum)
Unchanged  From last visit. Does not awaken her but coughs daily. Worst in am then cough occurs all day to a lesser extent. Produces clear phlegm. Zyrtec will be increase to bid. Consider adding a Mucinex in evening and hydrate later
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
## 2015-02-03 NOTE — Assessment & Plan Note (Signed)
No recent  

## 2015-02-03 NOTE — Assessment & Plan Note (Addendum)
Patient requesting a trial of Dexilant. Patient has not been able to proceed with PH probe due to insurance concerns. She cannot afford the amount of pocket. Will try a trial of Dexilant

## 2015-02-03 NOTE — Progress Notes (Signed)
Pre visit review using our clinic review tool, if applicable. No additional management support is needed unless otherwise documented below in the visit note. 

## 2015-02-03 NOTE — Assessment & Plan Note (Signed)
Does not tolerate nasal sprays, uses Zyrtec and Singulair daily may try Zyrtec bid

## 2015-02-28 ENCOUNTER — Other Ambulatory Visit: Payer: Self-pay | Admitting: Family Medicine

## 2015-02-28 ENCOUNTER — Telehealth: Payer: Self-pay | Admitting: Family Medicine

## 2015-02-28 DIAGNOSIS — M545 Low back pain: Secondary | ICD-10-CM

## 2015-02-28 NOTE — Telephone Encounter (Signed)
Pt needing referral for chiropractor Dr. Marya Landry. Pt has appt this morning at 8:30am. And 3 more appts next week. She's already been twice but forgot to get referral with [facility]. Please enter ASAP. She has a pinched nerve and bulging disc in back (bw L4 and L5).

## 2015-02-28 NOTE — Telephone Encounter (Signed)
Please advise 

## 2015-03-12 ENCOUNTER — Encounter: Payer: Self-pay | Admitting: Family

## 2015-03-12 ENCOUNTER — Ambulatory Visit (INDEPENDENT_AMBULATORY_CARE_PROVIDER_SITE_OTHER): Payer: 59 | Admitting: Family

## 2015-03-12 VITALS — BP 86/62 | HR 82 | Temp 98.7°F | Resp 16 | Ht 67.0 in | Wt 148.0 lb

## 2015-03-12 DIAGNOSIS — M5441 Lumbago with sciatica, right side: Secondary | ICD-10-CM

## 2015-03-12 MED ORDER — METHYLPREDNISOLONE 4 MG PO TBPK: ORAL_TABLET | ORAL | Status: DC

## 2015-03-12 MED ORDER — CYCLOBENZAPRINE HCL 5 MG PO TABS: 5.0000 mg | ORAL_TABLET | Freq: Two times a day (BID) | ORAL | Status: DC | PRN

## 2015-03-12 NOTE — Progress Notes (Signed)
Pre visit review using our clinic review tool, if applicable. No additional management support is needed unless otherwise documented below in the visit note. 

## 2015-03-12 NOTE — Patient Instructions (Signed)
Start medrol dose pak. You may use flexeril twice daily as needed (may cause drowsiness though) Call if pain worsens, if you develop bowel/bladder incontinence, or if symptoms are not improved in 1 week.
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
## 2015-03-14 ENCOUNTER — Telehealth: Payer: Self-pay | Admitting: Family Medicine

## 2015-03-14 NOTE — Telephone Encounter (Signed)
Caller name: Averee  Relationship to patient: Self   Can be reached: [phone]   Reason for call: pt says that Melissa prescribed her a medication on 11/16, she have questions about the medication.   Please advise.    Thanks.

## 2015-03-14 NOTE — Telephone Encounter (Signed)
Spoke with pt. She states that she has been taking flexeril twice a day and it seems to be wearing off after about 4-4/12 hours. Reports that it doesn't make her sleepy and she is wondering if she can take a third one each day. She is also on her third day of prednisone. IF she can't increase flexeril she wants to know if she can take ibuprofen 800mg  with what she is already doing?

## 2015-03-14 NOTE — Telephone Encounter (Signed)
Left message for pt to return my call.

## 2015-03-14 NOTE — Telephone Encounter (Signed)
It is ok for her to use the flexeril TID for short term.

## 2015-03-14 NOTE — Telephone Encounter (Signed)
Notified pt and she voices understanding. 

## 2015-03-23 ENCOUNTER — Emergency Department (HOSPITAL_BASED_OUTPATIENT_CLINIC_OR_DEPARTMENT_OTHER)
Admission: EM | Admit: 2015-03-23 | Discharge: 2015-03-23 | Disposition: A | Discharge status: Home / Self Care | Payer: 59 | Attending: Emergency Medicine | Admitting: Emergency Medicine

## 2015-03-23 ENCOUNTER — Encounter (HOSPITAL_BASED_OUTPATIENT_CLINIC_OR_DEPARTMENT_OTHER): Payer: Self-pay | Admitting: *Deleted

## 2015-03-23 DIAGNOSIS — Z79899 Other long term (current) drug therapy: Secondary | ICD-10-CM | POA: Insufficient documentation

## 2015-03-23 DIAGNOSIS — Z8781 Personal history of (healed) traumatic fracture: Secondary | ICD-10-CM | POA: Diagnosis not present

## 2015-03-23 DIAGNOSIS — Z8619 Personal history of other infectious and parasitic diseases: Secondary | ICD-10-CM | POA: Diagnosis not present

## 2015-03-23 DIAGNOSIS — R200 Anesthesia of skin: Secondary | ICD-10-CM | POA: Insufficient documentation

## 2015-03-23 DIAGNOSIS — M5431 Sciatica, right side: Secondary | ICD-10-CM | POA: Diagnosis not present

## 2015-03-23 DIAGNOSIS — K219 Gastro-esophageal reflux disease without esophagitis: Secondary | ICD-10-CM | POA: Insufficient documentation

## 2015-03-23 DIAGNOSIS — M545 Low back pain: Secondary | ICD-10-CM | POA: Diagnosis present

## 2015-03-23 MED ORDER — HYDROCODONE-ACETAMINOPHEN 5-325 MG PO TABS
2.0000 | ORAL_TABLET | Freq: Once | ORAL | Status: AC
  Administered 2015-03-23: 2 via ORAL
  Filled 2015-03-23: qty 2

## 2015-03-23 MED ORDER — HYDROCODONE-ACETAMINOPHEN 5-325 MG PO TABS: 1.0000 | ORAL_TABLET | Freq: Four times a day (QID) | ORAL | Status: DC | PRN
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
## 2015-03-23 NOTE — Discharge Instructions (Signed)
Sciatica Call Sherry Allen or Dr. Maryellen Pile tomorrow to schedule a follow-up appointment. We suggest that you need an MRI of your lumbar spine to find out the cause of sciatica. Take Tylenol or Advil for mild pain or the pain medicine prescribed for bad pain. Don't take Tylenol together with the medication prescribed is the combination could be dangerous your liver. Sciatica is pain, weakness, numbness, or tingling along the path of the sciatic nerve. The nerve starts in the lower back and runs down the back of each leg. The nerve controls the muscles in the lower leg and in the back of the knee, while also providing sensation to the back of the thigh, lower leg, and the sole of your foot. Sciatica is a symptom of another medical condition. For instance, nerve damage or certain conditions, such as a herniated disk or bone spur on the spine, pinch or put pressure on the sciatic nerve. This causes the pain, weakness, or other sensations normally associated with sciatica. Generally, sciatica only affects one side of the body. CAUSES   Herniated or slipped disc.  Degenerative disk disease.  A pain disorder involving the narrow muscle in the buttocks (piriformis syndrome).  Pelvic injury or fracture.  Pregnancy.  Tumor (rare). SYMPTOMS  Symptoms can vary from mild to very severe. The symptoms usually travel from the low back to the buttocks and down the back of the leg. Symptoms can include:  Mild tingling or dull aches in the lower back, leg, or hip.  Numbness in the back of the calf or sole of the foot.  Burning sensations in the lower back, leg, or hip.  Sharp pains in the lower back, leg, or hip.  Leg weakness.  Severe back pain inhibiting movement. These symptoms may get worse with coughing, sneezing, laughing, or prolonged sitting or standing. Also, being overweight may worsen symptoms. DIAGNOSIS  Your caregiver will perform a physical exam to look for common symptoms of sciatica.  He or she may ask you to do certain movements or activities that would trigger sciatic nerve pain. Other tests may be performed to find the cause of the sciatica. These may include:  Blood tests.  X-rays.  Imaging tests, such as an MRI or CT scan. TREATMENT  Treatment is directed at the cause of the sciatic pain. Sometimes, treatment is not necessary and the pain and discomfort goes away on its own. If treatment is needed, your caregiver may suggest:  Over-the-counter medicines to relieve pain.  Prescription medicines, such as anti-inflammatory medicine, muscle relaxants, or narcotics.  Applying heat or ice to the painful area.  Steroid injections to lessen pain, irritation, and inflammation around the nerve.  Reducing activity during periods of pain.  Exercising and stretching to strengthen your abdomen and improve flexibility of your spine. Your caregiver may suggest losing weight if the extra weight makes the back pain worse.  Physical therapy.  Surgery to eliminate what is pressing or pinching the nerve, such as a bone spur or part of a herniated disk. HOME CARE INSTRUCTIONS   Only take over-the-counter or prescription medicines for pain or discomfort as directed by your caregiver.  Apply ice to the affected area for 20 minutes, 3-4 times a day for the first 48-72 hours. Then try heat in the same way.  Exercise, stretch, or perform your usual activities if these do not aggravate your pain.  Attend physical therapy sessions as directed by your caregiver.  Keep all follow-up appointments as directed by your caregiver.  Do not wear high heels or shoes that do not provide proper support.  Check your mattress to see if it is too soft. A firm mattress may lessen your pain and discomfort. SEEK IMMEDIATE MEDICAL CARE IF:   You lose control of your bowel or bladder (incontinence).  You have increasing weakness in the lower back, pelvis, buttocks, or legs.  You have redness or  swelling of your back.  You have a burning sensation when you urinate.  You have pain that gets worse when you lie down or awakens you at night.  Your pain is worse than you have experienced in the past.  Your pain is lasting longer than 4 weeks.  You are suddenly losing weight without reason. MAKE SURE YOU:  Understand these instructions.  Will watch your condition.  Will get help right away if you are not doing well or get worse.   This information is not intended to replace advice given to you by your health care provider. Make sure you discuss any questions you have with your health care provider.   Document Released: 04/06/2001 Document Revised: 01/01/2015 Document Reviewed: 08/22/2011 Elsevier Interactive Patient Education [employer].

## 2015-03-23 NOTE — ED Notes (Signed)
Pt seeing PCP x 1 month and chiropractor for sciatica- Pain worse since yesterday- C/o back pain radiating down right leg

## 2015-03-24 ENCOUNTER — Telehealth: Payer: Self-pay | Admitting: Family Medicine

## 2015-03-24 ENCOUNTER — Other Ambulatory Visit: Payer: Self-pay | Admitting: Family Medicine

## 2015-03-24 ENCOUNTER — Ambulatory Visit (HOSPITAL_BASED_OUTPATIENT_CLINIC_OR_DEPARTMENT_OTHER)
Admission: RE | Admit: 2015-03-24 | Discharge: 2015-03-24 | Disposition: A | Discharge status: Home / Self Care | Payer: 59 | Source: Ambulatory Visit | Attending: Family Medicine | Admitting: Family Medicine

## 2015-03-24 DIAGNOSIS — M47896 Other spondylosis, lumbar region: Secondary | ICD-10-CM | POA: Insufficient documentation

## 2015-03-24 DIAGNOSIS — M5416 Radiculopathy, lumbar region: Secondary | ICD-10-CM

## 2015-03-24 DIAGNOSIS — M545 Low back pain: Secondary | ICD-10-CM | POA: Diagnosis present

## 2015-03-24 DIAGNOSIS — M5440 Lumbago with sciatica, unspecified side: Secondary | ICD-10-CM

## 2015-03-24 NOTE — Telephone Encounter (Signed)
Relation to pt: self Call back number: [phone]   Reason for call:  Patient was seen in the ED 03/23/15 for sciatica and the ED phsycian advised patient is in need of a MRI but stated it could not be ordered on Sunday and to get the order from PCP. Please advise patient directly. Best # work [phone]

## 2015-03-24 NOTE — Telephone Encounter (Signed)
MRI ordered, can change or increase her pain med if she is not tolerating her pain med or it is not helping enoughr

## 2015-03-24 NOTE — Telephone Encounter (Signed)
Pt said ok for xray first. She said she had some a few weeks ago at a chiropractor office. She is ok to do again before MRI. Work [phone] Cell [phone]

## 2015-03-24 NOTE — Telephone Encounter (Signed)
The patient got her xray done. She called me to inform she is in severe pain, unable to take oxycodone given to her in the hospital as it makes her terrible nauseas.  The states the pain is so bad she is having difficulty walking and is in tears.  The pain goes all the way down her right leg/ankle.  Please advise

## 2015-03-24 NOTE — Telephone Encounter (Signed)
Xray ordered. Patient informed and will come by today to have xray done.

## 2015-03-24 NOTE — Telephone Encounter (Signed)
Insurance (if she had it) will not pay for MRI if no xray first so order her a lumbar xray for low back pain and right lower extremity radiculopathy. Make sure she has not had a new recent fall or incontinence. After that can proceed with MRI

## 2015-03-25 ENCOUNTER — Other Ambulatory Visit (INDEPENDENT_AMBULATORY_CARE_PROVIDER_SITE_OTHER): Payer: 59

## 2015-03-25 ENCOUNTER — Other Ambulatory Visit: Payer: Self-pay | Admitting: Family Medicine

## 2015-03-25 DIAGNOSIS — N200 Calculus of kidney: Secondary | ICD-10-CM

## 2015-03-25 DIAGNOSIS — M5489 Other dorsalgia: Secondary | ICD-10-CM

## 2015-03-25 LAB — COMPREHENSIVE METABOLIC PANEL
ALBUMIN: 4.2 g/dL (ref 3.5–5.2)
ALT: 30 U/L (ref 0–35)
AST: 22 U/L (ref 0–37)
Alkaline Phosphatase: 38 U/L — ABNORMAL LOW (ref 39–117)
BUN: 13 mg/dL (ref 6–23)
CHLORIDE: 100 meq/L (ref 96–112)
CO2: 28 meq/L (ref 19–32)
Calcium: 9.4 mg/dL (ref 8.4–10.5)
Creatinine, Ser: 0.64 mg/dL (ref 0.40–1.20)
GFR: 107.09 mL/min (ref >60.00)
Glucose, Bld: 90 mg/dL (ref 70–99)
POTASSIUM: 3.8 meq/L (ref 3.5–5.1)
SODIUM: 135 meq/L (ref 135–145)
Total Bilirubin: 0.5 mg/dL (ref 0.2–1.2)
Total Protein: 6.7 g/dL (ref 6.0–8.3)

## 2015-03-25 LAB — CBC WITH DIFFERENTIAL/PLATELET
BASOS PCT: 0.3 % (ref 0.0–3.0)
Basophils Absolute: 0 10*3/uL (ref 0.0–0.1)
EOS ABS: 0.2 10*3/uL (ref 0.0–0.7)
EOS PCT: 3.2 % (ref 0.0–5.0)
HCT: 40.3 % (ref 36.0–46.0)
Hemoglobin: 13.4 g/dL (ref 12.0–15.0)
LYMPHS ABS: 1.5 10*3/uL (ref 0.7–4.0)
Lymphocytes Relative: 21.8 % (ref 12.0–46.0)
MCHC: 33.3 g/dL (ref 30.0–36.0)
MCV: 88.7 fl (ref 78.0–100.0)
MONO ABS: 0.5 10*3/uL (ref 0.1–1.0)
Monocytes Relative: 7.5 % (ref 3.0–12.0)
NEUTROS ABS: 4.7 10*3/uL (ref 1.4–7.7)
NEUTROS PCT: 67.2 % (ref 43.0–77.0)
PLATELETS: 264 10*3/uL (ref 150.0–400.0)
RBC: 4.55 Mil/uL (ref 3.87–5.11)
RDW: 12.7 % (ref 11.5–15.5)
WBC: 7 10*3/uL (ref 4.0–10.5)

## 2015-03-25 LAB — URINALYSIS, ROUTINE W REFLEX MICROSCOPIC
BILIRUBIN URINE: NEGATIVE
KETONES UR: NEGATIVE
Leukocytes, UA: NEGATIVE
NITRITE: NEGATIVE
PH: 5.5 (ref 5.0–8.0)
Specific Gravity, Urine: >=1.03 — AB (ref 1.000–1.030)
Total Protein, Urine: NEGATIVE
Urine Glucose: NEGATIVE
Urobilinogen, UA: 0.2 (ref 0.0–1.0)

## 2015-03-25 NOTE — Telephone Encounter (Signed)
Patient has taken more oxycodone only eating a lot in order not to get sick, but still causes nausea, also states she is "eating" motrin due to the pain. She would like something to help. She states her butt bone hurts so badly as if she has fallen on it, but she has had no fall and states having right leg numbness. [facility] is her pharmacy. Work call back number [phone]

## 2015-03-25 NOTE — Telephone Encounter (Signed)
Put order in for labs. 

## 2015-03-25 NOTE — Telephone Encounter (Signed)
Patient informed of PCP instructions.  She scheduled appt. With PCP for This coming Thursday December 1.

## 2015-03-25 NOTE — Telephone Encounter (Signed)
Make sure no more than 800 mg of Ibuprofen every 8 hours or her kidneys will get mad. Can also max out Tylenol/Acetaminophen. Each Oxycodone tab has 325 mg of Tylenol and in 24 hours can take up to 3000 mg of Tylenol, max of 1300 at a time I often recommend Tylenol ES 500 mg tabs 1-2 up to 3 times daily not to exceed 3000. She should come in for evaluation if worsening

## 2015-03-27 ENCOUNTER — Ambulatory Visit (INDEPENDENT_AMBULATORY_CARE_PROVIDER_SITE_OTHER): Payer: 59 | Admitting: Family Medicine

## 2015-03-27 ENCOUNTER — Encounter: Payer: Self-pay | Admitting: Family Medicine

## 2015-03-27 VITALS — BP 102/70 | HR 80 | Temp 98.0°F | Ht 67.0 in | Wt 147.4 lb

## 2015-03-27 DIAGNOSIS — K219 Gastro-esophageal reflux disease without esophagitis: Secondary | ICD-10-CM

## 2015-03-27 DIAGNOSIS — M5441 Lumbago with sciatica, right side: Secondary | ICD-10-CM | POA: Diagnosis not present

## 2015-03-27 DIAGNOSIS — R112 Nausea with vomiting, unspecified: Secondary | ICD-10-CM | POA: Diagnosis not present

## 2015-03-27 DIAGNOSIS — M79604 Pain in right leg: Secondary | ICD-10-CM

## 2015-03-27 DIAGNOSIS — M545 Low back pain: Secondary | ICD-10-CM

## 2015-03-27 LAB — CULTURE, URINE COMPREHENSIVE
Colony Count: NO GROWTH
Organism ID, Bacteria: NO GROWTH

## 2015-03-27 MED ORDER — ONDANSETRON HCL 4 MG PO TABS: 4.0000 mg | ORAL_TABLET | Freq: Three times a day (TID) | ORAL | Status: DC | PRN

## 2015-03-27 MED ORDER — METHOCARBAMOL 500 MG PO TABS: 500.0000 mg | ORAL_TABLET | Freq: Three times a day (TID) | ORAL | Status: DC | PRN

## 2015-03-27 MED ORDER — OXYCODONE-ACETAMINOPHEN 5-325 MG PO TABS: 1.0000 | ORAL_TABLET | Freq: Three times a day (TID) | ORAL | Status: DC | PRN

## 2015-03-27 NOTE — Progress Notes (Signed)
Pre visit review using our clinic review tool, if applicable. No additional management support is needed unless otherwise documented below in the visit note. 

## 2015-03-27 NOTE — Patient Instructions (Signed)

## 2015-03-29 ENCOUNTER — Ambulatory Visit (HOSPITAL_BASED_OUTPATIENT_CLINIC_OR_DEPARTMENT_OTHER)
Admission: RE | Admit: 2015-03-29 | Discharge: 2015-03-29 | Disposition: A | Discharge status: Home / Self Care | Payer: 59 | Source: Ambulatory Visit | Attending: Family Medicine | Admitting: Family Medicine

## 2015-03-29 DIAGNOSIS — M79604 Pain in right leg: Secondary | ICD-10-CM | POA: Insufficient documentation

## 2015-03-29 DIAGNOSIS — M5440 Lumbago with sciatica, unspecified side: Secondary | ICD-10-CM | POA: Insufficient documentation

## 2015-03-29 DIAGNOSIS — M5117 Intervertebral disc disorders with radiculopathy, lumbosacral region: Secondary | ICD-10-CM | POA: Insufficient documentation

## 2015-03-29 DIAGNOSIS — G548 Other nerve root and plexus disorders: Secondary | ICD-10-CM | POA: Diagnosis not present

## 2015-03-30 ENCOUNTER — Encounter: Payer: Self-pay | Admitting: Family Medicine

## 2015-03-30 NOTE — Assessment & Plan Note (Signed)
Avoid offending foods, start probiotics. Do not eat large meals in late evening and consider raising head of bed. Minimize NSAIDs if symptoms return
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
## 2015-04-16 ENCOUNTER — Encounter: Payer: Self-pay | Admitting: Family Medicine

## 2015-04-27 HISTORY — PX: TONSILECTOMY, ADENOIDECTOMY, BILATERAL MYRINGOTOMY AND TUBES: SHX2538

## 2015-06-19 ENCOUNTER — Telehealth: Payer: Self-pay | Admitting: General Practice

## 2015-06-19 NOTE — Telephone Encounter (Signed)
PA received from [facility] for patients Dexilant. PA began with cover my meds [id number])

## 2015-06-20 NOTE — Telephone Encounter (Signed)
Received Approval on Dexilant via [place] valid 06/19/15 through 04/25/38; forwarded to pharmacy/SLS 02/24

## 2015-06-26 ENCOUNTER — Telehealth: Payer: Self-pay | Admitting: Family Medicine

## 2015-06-26 NOTE — Telephone Encounter (Signed)
I am fine with the May appt if patient is, if not let me know

## 2015-06-26 NOTE — Telephone Encounter (Signed)
Relation to [id number] Call back [phone]  Reason for call:  Patient  August 19, 2015 7:15am physical had to be [facility] due to provider. Patient states she would need another morning appointment, patient scheduled for 09/11/2015 at 7:45am. Please advise

## 2015-06-27 NOTE — Telephone Encounter (Signed)
Patient is fine with date and time. Thank you

## 2015-08-19 ENCOUNTER — Encounter: Payer: 59 | Admitting: Family Medicine

## 2015-09-11 ENCOUNTER — Encounter: Payer: Self-pay | Admitting: Family Medicine

## 2015-10-16 ENCOUNTER — Other Ambulatory Visit: Payer: Self-pay | Admitting: Family Medicine

## 2015-10-16 ENCOUNTER — Encounter: Payer: Self-pay | Admitting: Family Medicine

## 2015-10-16 MED ORDER — OMEPRAZOLE 40 MG PO CPDR: 40.0000 mg | DELAYED_RELEASE_CAPSULE | Freq: Every day | ORAL | Status: DC

## 2015-11-05 ENCOUNTER — Other Ambulatory Visit: Payer: Self-pay | Admitting: Family Medicine

## 2015-11-05 NOTE — Telephone Encounter (Signed)
Medication filled to pharmacy as requested.   

## 2015-11-07 ENCOUNTER — Other Ambulatory Visit: Payer: Self-pay | Admitting: Otolaryngology

## 2016-06-15 DIAGNOSIS — Z1212 Encounter for screening for malignant neoplasm of rectum: Secondary | ICD-10-CM | POA: Diagnosis not present

## 2016-06-15 DIAGNOSIS — Z6822 Body mass index (BMI) 22.0-22.9, adult: Secondary | ICD-10-CM | POA: Diagnosis not present

## 2016-06-15 DIAGNOSIS — Z1231 Encounter for screening mammogram for malignant neoplasm of breast: Secondary | ICD-10-CM | POA: Diagnosis not present

## 2016-06-15 DIAGNOSIS — Z01419 Encounter for gynecological examination (general) (routine) without abnormal findings: Secondary | ICD-10-CM | POA: Diagnosis not present

## 2016-06-15 LAB — HM PAP SMEAR: HM PAP: NORMAL

## 2016-06-16 ENCOUNTER — Other Ambulatory Visit: Payer: Self-pay | Admitting: Obstetrics and Gynecology

## 2016-06-16 DIAGNOSIS — R928 Other abnormal and inconclusive findings on diagnostic imaging of breast: Secondary | ICD-10-CM

## 2016-06-17 ENCOUNTER — Ambulatory Visit
Admission: RE | Admit: 2016-06-17 | Discharge: 2016-06-17 | Disposition: A | Discharge status: Home / Self Care | Payer: BLUE CROSS/BLUE SHIELD | Source: Ambulatory Visit | Attending: Obstetrics and Gynecology | Admitting: Obstetrics and Gynecology

## 2016-06-17 DIAGNOSIS — R928 Other abnormal and inconclusive findings on diagnostic imaging of breast: Secondary | ICD-10-CM

## 2016-06-17 DIAGNOSIS — R922 Inconclusive mammogram: Secondary | ICD-10-CM | POA: Diagnosis not present

## 2016-06-18 ENCOUNTER — Other Ambulatory Visit: Payer: BLUE CROSS/BLUE SHIELD

## 2016-06-21 ENCOUNTER — Other Ambulatory Visit: Payer: BLUE CROSS/BLUE SHIELD

## 2016-06-25 DIAGNOSIS — D2372 Other benign neoplasm of skin of left lower limb, including hip: Secondary | ICD-10-CM | POA: Diagnosis not present

## 2016-06-25 DIAGNOSIS — D235 Other benign neoplasm of skin of trunk: Secondary | ICD-10-CM | POA: Diagnosis not present

## 2016-06-25 DIAGNOSIS — L814 Other melanin hyperpigmentation: Secondary | ICD-10-CM | POA: Diagnosis not present

## 2016-10-04 ENCOUNTER — Encounter: Payer: Self-pay | Admitting: Family Medicine

## 2016-10-04 ENCOUNTER — Ambulatory Visit (INDEPENDENT_AMBULATORY_CARE_PROVIDER_SITE_OTHER): Payer: BLUE CROSS/BLUE SHIELD | Admitting: Family Medicine

## 2016-10-04 VITALS — BP 110/72 | HR 80 | Temp 98.2°F | Ht 67.0 in | Wt 152.4 lb

## 2016-10-04 DIAGNOSIS — R002 Palpitations: Secondary | ICD-10-CM

## 2016-10-04 DIAGNOSIS — Z0000 Encounter for general adult medical examination without abnormal findings: Secondary | ICD-10-CM | POA: Diagnosis not present

## 2016-10-04 DIAGNOSIS — K219 Gastro-esophageal reflux disease without esophagitis: Secondary | ICD-10-CM | POA: Diagnosis not present

## 2016-10-04 DIAGNOSIS — R252 Cramp and spasm: Secondary | ICD-10-CM

## 2016-10-04 DIAGNOSIS — M461 Sacroiliitis, not elsewhere classified: Secondary | ICD-10-CM | POA: Diagnosis not present

## 2016-10-04 DIAGNOSIS — R05 Cough: Secondary | ICD-10-CM

## 2016-10-04 DIAGNOSIS — M79604 Pain in right leg: Secondary | ICD-10-CM

## 2016-10-04 DIAGNOSIS — M545 Low back pain: Secondary | ICD-10-CM

## 2016-10-04 DIAGNOSIS — R059 Cough, unspecified: Secondary | ICD-10-CM

## 2016-10-04 HISTORY — DX: Encounter for general adult medical examination without abnormal findings: Z00.00

## 2016-10-04 HISTORY — DX: Sacroiliitis, not elsewhere classified: M46.1

## 2016-10-04 LAB — CBC
HEMATOCRIT: 39.3 % (ref 36.0–46.0)
Hemoglobin: 13.3 g/dL (ref 12.0–15.0)
MCHC: 33.7 g/dL (ref 30.0–36.0)
MCV: 88 fl (ref 78.0–100.0)
Platelets: 297 10*3/uL (ref 150.0–400.0)
RBC: 4.46 Mil/uL (ref 3.87–5.11)
RDW: 12.7 % (ref 11.5–15.5)
WBC: 6.4 10*3/uL (ref 4.0–10.5)

## 2016-10-04 LAB — COMPREHENSIVE METABOLIC PANEL
ALT: 28 U/L (ref 0–35)
AST: 18 U/L (ref 0–37)
Albumin: 4.4 g/dL (ref 3.5–5.2)
Alkaline Phosphatase: 57 U/L (ref 39–117)
BUN: 18 mg/dL (ref 6–23)
CO2: 30 mEq/L (ref 19–32)
CREATININE: 0.63 mg/dL (ref 0.40–1.20)
Calcium: 9.8 mg/dL (ref 8.4–10.5)
Chloride: 100 mEq/L (ref 96–112)
GFR: 108.3 mL/min (ref >60.00)
GLUCOSE: 83 mg/dL (ref 70–99)
POTASSIUM: 3.6 meq/L (ref 3.5–5.1)
SODIUM: 137 meq/L (ref 135–145)
TOTAL PROTEIN: 7.4 g/dL (ref 6.0–8.3)
Total Bilirubin: 0.4 mg/dL (ref 0.2–1.2)

## 2016-10-04 LAB — LIPID PANEL
Cholesterol: 160 mg/dL (ref 0–200)
HDL: 66 mg/dL (ref >39.00)
LDL CALC: 83 mg/dL (ref 0–99)
NONHDL: 93.51
Total CHOL/HDL Ratio: 2
Triglycerides: 53 mg/dL (ref 0.0–149.0)
VLDL: 10.6 mg/dL (ref 0.0–40.0)

## 2016-10-04 LAB — TSH: TSH: 0.95 u[IU]/mL (ref 0.35–4.50)

## 2016-10-04 LAB — MAGNESIUM: Magnesium: 2.1 mg/dL (ref 1.5–2.5)

## 2016-10-04 LAB — T4, FREE: FREE T4: 0.64 ng/dL (ref 0.60–1.60)

## 2016-10-04 MED ORDER — TIZANIDINE HCL 4 MG PO TABS: 4.0000 mg | ORAL_TABLET | Freq: Two times a day (BID) | ORAL | 1 refills | Status: DC | PRN

## 2016-10-04 MED ORDER — ALPRAZOLAM 0.25 MG PO TABS: 0.2500 mg | ORAL_TABLET | Freq: Two times a day (BID) | ORAL | 1 refills | Status: DC | PRN

## 2016-10-04 MED ORDER — OMEPRAZOLE-SODIUM BICARBONATE 40-1100 MG PO CAPS: 1.0000 | ORAL_CAPSULE | Freq: Every day | ORAL | 5 refills | Status: DC

## 2016-10-04 NOTE — Progress Notes (Signed)
Pre visit review using our clinic review tool, if applicable. No additional management support is needed unless otherwise documented below in the visit note. 
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
## 2016-10-04 NOTE — Patient Instructions (Signed)
Lidocaine patches Aspercreme, Salon Pas and Icy Hot,   Hyland's Leg cramp med   Preventive Care 40-64 Years, Female Preventive care refers to lifestyle choices and visits with your health care provider that can promote health and wellness. What does preventive care include?  A yearly physical exam. This is also called an annual well check.  Dental exams once or twice a year.  Routine eye exams. Ask your health care provider how often you should have your eyes checked.  Personal lifestyle choices, including: ? Daily care of your teeth and gums. ? Regular physical activity. ? Eating a healthy diet. ? Avoiding tobacco and drug use. ? Limiting alcohol use. ? Practicing safe sex. ? Taking low-dose aspirin daily starting at age 50. ? Taking vitamin and mineral supplements as recommended by your health care provider. What happens during an annual well check? The services and screenings done by your health care provider during your annual well check will depend on your age, overall health, lifestyle risk factors, and family history of disease. Counseling Your health care provider may ask you questions about your:  Alcohol use.  Tobacco use.  Drug use.  Emotional well-being.  Home and relationship well-being.  Sexual activity.  Eating habits.  Work and work Statistician.  Method of birth control.  Menstrual cycle.  Pregnancy history.  Screening You may have the following tests or measurements:  Height, weight, and BMI.  Blood pressure.  Lipid and cholesterol levels. These may be checked every 5 years, or more frequently if you are over 46 years old.  Skin check.  Lung cancer screening. You may have this screening every year starting at age 46 if you have a 30-pack-year history of smoking and currently smoke or have quit within the past 15 years.  Fecal occult blood test (FOBT) of the stool. You may have this test every year starting at age 37.  Flexible  sigmoidoscopy or colonoscopy. You may have a sigmoidoscopy every 5 years or a colonoscopy every 10 years starting at age 71.  Hepatitis C blood test.  Hepatitis B blood test.  Sexually transmitted disease (STD) testing.  Diabetes screening. This is done by checking your blood sugar (glucose) after you have not eaten for a while (fasting). You may have this done every 1-3 years.  Mammogram. This may be done every 1-2 years. Talk to your health care provider about when you should start having regular mammograms. This may depend on whether you have a family history of breast cancer.  BRCA-related cancer screening. This may be done if you have a family history of breast, ovarian, tubal, or peritoneal cancers.  Pelvic exam and Pap test. This may be done every 3 years starting at age 46. Starting at age 46, this may be done every 5 years if you have a Pap test in combination with an HPV test.  Bone density scan. This is done to screen for osteoporosis. You may have this scan if you are at high risk for osteoporosis.  Discuss your test results, treatment options, and if necessary, the need for more tests with your health care provider. Vaccines Your health care provider may recommend certain vaccines, such as:  Influenza vaccine. This is recommended every year.  Tetanus, diphtheria, and acellular pertussis (Tdap, Td) vaccine. You may need a Td booster every 10 years.  Varicella vaccine. You may need this if you have not been vaccinated.  Zoster vaccine. You may need this after age 29.  Measles, mumps, and rubella (  MMR) vaccine. You may need at least one dose of MMR if you were born in 1957 or later. You may also need a second dose.  Pneumococcal 13-valent conjugate (PCV13) vaccine. You may need this if you have certain conditions and were not previously vaccinated.  Pneumococcal polysaccharide (PPSV23) vaccine. You may need one or two doses if you smoke cigarettes or if you have certain  conditions.  Meningococcal vaccine. You may need this if you have certain conditions.  Hepatitis A vaccine. You may need this if you have certain conditions or if you travel or work in places where you may be exposed to hepatitis A.  Hepatitis B vaccine. You may need this if you have certain conditions or if you travel or work in places where you may be exposed to hepatitis B.  Haemophilus influenzae type b (Hib) vaccine. You may need this if you have certain conditions.  Talk to your health care provider about which screenings and vaccines you need and how often you need them. This information is not intended to replace advice given to you by your health care provider. Make sure you discuss any questions you have with your health care provider. Document Released: 05/09/2015 Document Revised: 12/31/2015 Document Reviewed: 02/11/2015 Elsevier Interactive Patient Education  2017 [employer].

## 2016-10-04 NOTE — Assessment & Plan Note (Signed)
Patient very dehydrated and has night time leg cramps. Increase hydration, try Hyland's leg cramp med and hydrate better.

## 2016-10-04 NOTE — Assessment & Plan Note (Addendum)
Continues to struggle with am cough but has not proceeded with with upper endoscopy due to cost and her trouble with insurance, she is encouraged to proceed when she is able.

## 2016-10-04 NOTE — Assessment & Plan Note (Signed)
Patient encouraged to maintain heart healthy diet, regular exercise, adequate sleep. Consider daily probiotics. Take medications as prescribed. Labs ordered in time.

## 2016-10-04 NOTE — Assessment & Plan Note (Signed)
Persistent morning cough,with clear phlegm produced. Failed Protonix, Prilosec, Ranitidine, combination. Dexilant worked well for her but insurance will not cover it. Will try Zegerid.daily avoid offending

## 2016-10-05 ENCOUNTER — Telehealth: Payer: Self-pay

## 2016-10-05 NOTE — Telephone Encounter (Signed)
PA initiated via Covermymeds; KEY: [id number]. Awaiting determination.

## 2016-10-07 NOTE — Telephone Encounter (Addendum)
Received PA approval through 04/25/2038. PA approval faxed to [facility].

## 2016-10-08 NOTE — Assessment & Plan Note (Signed)
Has had previous surgeries with Dr Jacolyn Reedy and if she needs further surgery she will return to his practice.
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
## 2016-10-12 ENCOUNTER — Encounter: Payer: Self-pay | Admitting: Family Medicine

## 2016-10-13 ENCOUNTER — Encounter: Payer: Self-pay | Admitting: Family Medicine

## 2016-10-25 ENCOUNTER — Other Ambulatory Visit: Payer: Self-pay

## 2016-10-25 MED ORDER — FAMOTIDINE 20 MG PO TABS: 20.0000 mg | ORAL_TABLET | Freq: Two times a day (BID) | ORAL | 3 refills | Status: DC

## 2016-10-25 MED ORDER — PANTOPRAZOLE SODIUM 40 MG PO TBEC: 40.0000 mg | DELAYED_RELEASE_TABLET | Freq: Every day | ORAL | 3 refills | Status: DC

## 2016-11-01 ENCOUNTER — Other Ambulatory Visit: Payer: Self-pay | Admitting: Obstetrics and Gynecology

## 2016-11-01 DIAGNOSIS — R922 Inconclusive mammogram: Secondary | ICD-10-CM

## 2016-12-17 ENCOUNTER — Ambulatory Visit: Admission: RE | Admit: 2016-12-17 | Payer: BLUE CROSS/BLUE SHIELD | Source: Ambulatory Visit

## 2016-12-17 ENCOUNTER — Ambulatory Visit
Admission: RE | Admit: 2016-12-17 | Discharge: 2016-12-17 | Disposition: A | Discharge status: Home / Self Care | Payer: 59 | Source: Ambulatory Visit | Attending: Obstetrics and Gynecology | Admitting: Obstetrics and Gynecology

## 2016-12-17 ENCOUNTER — Other Ambulatory Visit: Payer: Self-pay | Admitting: Obstetrics and Gynecology

## 2016-12-17 DIAGNOSIS — R922 Inconclusive mammogram: Secondary | ICD-10-CM

## 2016-12-20 ENCOUNTER — Ambulatory Visit
Admission: RE | Admit: 2016-12-20 | Discharge: 2016-12-20 | Disposition: A | Discharge status: Home / Self Care | Payer: 59 | Source: Ambulatory Visit | Attending: Obstetrics and Gynecology | Admitting: Obstetrics and Gynecology

## 2016-12-20 DIAGNOSIS — R922 Inconclusive mammogram: Secondary | ICD-10-CM

## 2017-01-05 ENCOUNTER — Other Ambulatory Visit: Payer: Self-pay | Admitting: General Surgery

## 2017-01-05 DIAGNOSIS — R928 Other abnormal and inconclusive findings on diagnostic imaging of breast: Secondary | ICD-10-CM

## 2017-01-10 ENCOUNTER — Other Ambulatory Visit: Payer: Self-pay | Admitting: General Surgery

## 2017-01-10 DIAGNOSIS — R928 Other abnormal and inconclusive findings on diagnostic imaging of breast: Secondary | ICD-10-CM

## 2017-01-31 ENCOUNTER — Encounter (HOSPITAL_BASED_OUTPATIENT_CLINIC_OR_DEPARTMENT_OTHER): Payer: Self-pay | Admitting: *Deleted

## 2017-02-03 NOTE — Pre-Procedure Instructions (Signed)
Ensure pre-surgery drink 10 oz. given to pt. - to drink by 0530 DOS; pt. voiced understanding.

## 2017-02-04 ENCOUNTER — Ambulatory Visit
Admission: RE | Admit: 2017-02-04 | Discharge: 2017-02-04 | Disposition: A | Discharge status: Home / Self Care | Payer: 59 | Source: Ambulatory Visit | Attending: General Surgery | Admitting: General Surgery

## 2017-02-04 DIAGNOSIS — R928 Other abnormal and inconclusive findings on diagnostic imaging of breast: Secondary | ICD-10-CM

## 2017-02-06 NOTE — Anesthesia Preprocedure Evaluation (Signed)
Anesthesia Evaluation  Patient identified by MRN, date of birth, ID band Patient awake    Reviewed: Allergy & Precautions, NPO status , Patient's Chart, lab work & pertinent test results  Airway Mallampati: II  TM Distance: >3 FB Neck ROM: Full    Dental no notable dental hx.    Pulmonary neg pulmonary ROS,    Pulmonary exam normal breath sounds clear to auscultation       Cardiovascular negative cardio ROS Normal cardiovascular exam Rhythm:Regular Rate:Normal     Neuro/Psych negative neurological ROS  negative psych ROS   GI/Hepatic negative GI ROS, Neg liver ROS, GERD  Medicated,  Endo/Other  negative endocrine ROS  Renal/GU negative Renal ROS  negative genitourinary   Musculoskeletal negative musculoskeletal ROS (+) Arthritis , Osteoarthritis,    Abdominal   Peds negative pediatric ROS (+)  Hematology negative hematology ROS (+)   Anesthesia Other Findings   Reproductive/Obstetrics negative OB ROS                             Anesthesia Physical Anesthesia Plan  ASA: II  Anesthesia Plan: General   Post-op Pain Management:    Induction: Intravenous  PONV Risk Score and Plan: 3 and Ondansetron, Dexamethasone, Midazolam, Treatment may vary due to age or medical condition and Scopolamine patch - Pre-op  Airway Management Planned: LMA and Oral ETT  Additional Equipment:   Intra-op Plan:   Post-operative Plan:   Informed Consent:   Plan Discussed with: CRNA and Surgeon  Anesthesia Plan Comments: ( )        Anesthesia Quick Evaluation
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
## 2017-02-07 ENCOUNTER — Ambulatory Visit (HOSPITAL_BASED_OUTPATIENT_CLINIC_OR_DEPARTMENT_OTHER): Payer: 59 | Admitting: Anesthesiology

## 2017-02-07 ENCOUNTER — Ambulatory Visit (HOSPITAL_BASED_OUTPATIENT_CLINIC_OR_DEPARTMENT_OTHER)
Admission: RE | Admit: 2017-02-07 | Discharge: 2017-02-07 | Disposition: A | Discharge status: Home / Self Care | Payer: 59 | Source: Ambulatory Visit | Attending: General Surgery | Admitting: General Surgery

## 2017-02-07 ENCOUNTER — Ambulatory Visit
Admission: RE | Admit: 2017-02-07 | Discharge: 2017-02-07 | Disposition: A | Discharge status: Home / Self Care | Payer: 59 | Source: Ambulatory Visit | Attending: General Surgery | Admitting: General Surgery

## 2017-02-07 ENCOUNTER — Encounter (HOSPITAL_BASED_OUTPATIENT_CLINIC_OR_DEPARTMENT_OTHER): Payer: Self-pay | Admitting: *Deleted

## 2017-02-07 ENCOUNTER — Encounter (HOSPITAL_BASED_OUTPATIENT_CLINIC_OR_DEPARTMENT_OTHER)
Admission: RE | Disposition: A | Discharge status: Home / Self Care | Payer: Self-pay | Source: Ambulatory Visit | Attending: General Surgery

## 2017-02-07 DIAGNOSIS — Z803 Family history of malignant neoplasm of breast: Secondary | ICD-10-CM | POA: Diagnosis not present

## 2017-02-07 DIAGNOSIS — R928 Other abnormal and inconclusive findings on diagnostic imaging of breast: Secondary | ICD-10-CM

## 2017-02-07 DIAGNOSIS — M1990 Unspecified osteoarthritis, unspecified site: Secondary | ICD-10-CM | POA: Diagnosis not present

## 2017-02-07 DIAGNOSIS — N6320 Unspecified lump in the left breast, unspecified quadrant: Secondary | ICD-10-CM | POA: Diagnosis not present

## 2017-02-07 DIAGNOSIS — Z79899 Other long term (current) drug therapy: Secondary | ICD-10-CM | POA: Insufficient documentation

## 2017-02-07 DIAGNOSIS — K219 Gastro-esophageal reflux disease without esophagitis: Secondary | ICD-10-CM | POA: Diagnosis not present

## 2017-02-07 HISTORY — DX: Other abnormal and inconclusive findings on diagnostic imaging of breast: R92.8

## 2017-02-07 HISTORY — PX: BREAST LUMPECTOMY WITH RADIOACTIVE SEED LOCALIZATION: SHX6424

## 2017-02-07 HISTORY — DX: Anxiety disorder, unspecified: F41.9

## 2017-02-07 SURGERY — BREAST LUMPECTOMY WITH RADIOACTIVE SEED LOCALIZATION
Anesthesia: General | Site: Breast | Laterality: Left

## 2017-02-07 MED ORDER — BUPIVACAINE-EPINEPHRINE (PF) 0.5% -1:200000 IJ SOLN
INTRAMUSCULAR | Status: AC
  Filled 2017-02-07: qty 60

## 2017-02-07 MED ORDER — PROPOFOL 10 MG/ML IV BOLUS
INTRAVENOUS | Status: DC | PRN
  Administered 2017-02-07: 150 mg via INTRAVENOUS

## 2017-02-07 MED ORDER — BUPIVACAINE-EPINEPHRINE (PF) 0.5% -1:200000 IJ SOLN
INTRAMUSCULAR | Status: DC | PRN
  Administered 2017-02-07: 5 mL

## 2017-02-07 MED ORDER — FENTANYL CITRATE (PF) 100 MCG/2ML IJ SOLN: 25.0000 ug | INTRAMUSCULAR | Status: DC | PRN

## 2017-02-07 MED ORDER — NEOSTIGMINE METHYLSULFATE 10 MG/10ML IV SOLN
INTRAVENOUS | Status: DC | PRN
  Administered 2017-02-07: 4 mg via INTRAVENOUS

## 2017-02-07 MED ORDER — LIDOCAINE 2% (20 MG/ML) 5 ML SYRINGE
INTRAMUSCULAR | Status: AC
  Filled 2017-02-07: qty 5

## 2017-02-07 MED ORDER — CHLORHEXIDINE GLUCONATE CLOTH 2 % EX PADS: 6.0000 | MEDICATED_PAD | Freq: Once | CUTANEOUS | Status: DC

## 2017-02-07 MED ORDER — LACTATED RINGERS IV SOLN
INTRAVENOUS | Status: DC
  Administered 2017-02-07 (×2): via INTRAVENOUS

## 2017-02-07 MED ORDER — FENTANYL CITRATE (PF) 100 MCG/2ML IJ SOLN
INTRAMUSCULAR | Status: AC
  Filled 2017-02-07: qty 2

## 2017-02-07 MED ORDER — GLYCOPYRROLATE 0.2 MG/ML IJ SOLN
INTRAMUSCULAR | Status: DC | PRN
  Administered 2017-02-07: 0.2 mg via INTRAVENOUS
  Administered 2017-02-07: 0.6 mg via INTRAVENOUS

## 2017-02-07 MED ORDER — GABAPENTIN 300 MG PO CAPS
ORAL_CAPSULE | ORAL | Status: AC
  Filled 2017-02-07: qty 1

## 2017-02-07 MED ORDER — CEFAZOLIN SODIUM-DEXTROSE 2-4 GM/100ML-% IV SOLN
INTRAVENOUS | Status: AC
  Filled 2017-02-07: qty 100

## 2017-02-07 MED ORDER — MEPERIDINE HCL 25 MG/ML IJ SOLN: 6.2500 mg | INTRAMUSCULAR | Status: DC | PRN

## 2017-02-07 MED ORDER — ACETAMINOPHEN 500 MG PO TABS
1000.0000 mg | ORAL_TABLET | ORAL | Status: AC
  Administered 2017-02-07: 1000 mg via ORAL

## 2017-02-07 MED ORDER — MIDAZOLAM HCL 2 MG/2ML IJ SOLN
INTRAMUSCULAR | Status: AC
  Filled 2017-02-07: qty 2

## 2017-02-07 MED ORDER — GABAPENTIN 300 MG PO CAPS
300.0000 mg | ORAL_CAPSULE | ORAL | Status: AC
  Administered 2017-02-07: 300 mg via ORAL

## 2017-02-07 MED ORDER — MIDAZOLAM HCL 2 MG/2ML IJ SOLN
1.0000 mg | INTRAMUSCULAR | Status: DC | PRN
  Administered 2017-02-07: 2 mg via INTRAVENOUS

## 2017-02-07 MED ORDER — LIDOCAINE HCL (CARDIAC) 20 MG/ML IV SOLN
INTRAVENOUS | Status: DC | PRN
  Administered 2017-02-07: 50 mg via INTRAVENOUS

## 2017-02-07 MED ORDER — BUPIVACAINE HCL (PF) 0.5 % IJ SOLN
INTRAMUSCULAR | Status: AC
  Filled 2017-02-07: qty 30

## 2017-02-07 MED ORDER — CEFAZOLIN SODIUM-DEXTROSE 2-4 GM/100ML-% IV SOLN
2.0000 g | INTRAVENOUS | Status: AC
  Administered 2017-02-07: 2 g via INTRAVENOUS

## 2017-02-07 MED ORDER — SCOPOLAMINE 1 MG/3DAYS TD PT72: 1.0000 | MEDICATED_PATCH | Freq: Once | TRANSDERMAL | Status: DC | PRN

## 2017-02-07 MED ORDER — CELECOXIB 200 MG PO CAPS
200.0000 mg | ORAL_CAPSULE | ORAL | Status: AC
  Administered 2017-02-07: 200 mg via ORAL

## 2017-02-07 MED ORDER — PROPOFOL 10 MG/ML IV BOLUS
INTRAVENOUS | Status: AC
  Filled 2017-02-07: qty 40

## 2017-02-07 MED ORDER — PHENYLEPHRINE HCL 10 MG/ML IJ SOLN
INTRAMUSCULAR | Status: DC | PRN
  Administered 2017-02-07: 80 ug via INTRAVENOUS
  Administered 2017-02-07 (×3): 120 ug via INTRAVENOUS

## 2017-02-07 MED ORDER — ROCURONIUM BROMIDE 100 MG/10ML IV SOLN
INTRAVENOUS | Status: DC | PRN
  Administered 2017-02-07: 50 mg via INTRAVENOUS

## 2017-02-07 MED ORDER — FENTANYL CITRATE (PF) 100 MCG/2ML IJ SOLN
50.0000 ug | INTRAMUSCULAR | Status: DC | PRN
  Administered 2017-02-07: 100 ug via INTRAVENOUS

## 2017-02-07 MED ORDER — ONDANSETRON HCL 4 MG/2ML IJ SOLN: 4.0000 mg | Freq: Once | INTRAMUSCULAR | Status: DC | PRN

## 2017-02-07 MED ORDER — ONDANSETRON HCL 4 MG/2ML IJ SOLN
INTRAMUSCULAR | Status: AC
  Filled 2017-02-07: qty 2

## 2017-02-07 MED ORDER — DEXAMETHASONE SODIUM PHOSPHATE 10 MG/ML IJ SOLN
INTRAMUSCULAR | Status: AC
  Filled 2017-02-07: qty 1

## 2017-02-07 MED ORDER — HYDROCODONE-ACETAMINOPHEN 5-325 MG PO TABS: 1.0000 | ORAL_TABLET | Freq: Four times a day (QID) | ORAL | 0 refills | Status: DC | PRN

## 2017-02-07 MED ORDER — DEXAMETHASONE SODIUM PHOSPHATE 4 MG/ML IJ SOLN
INTRAMUSCULAR | Status: DC | PRN
  Administered 2017-02-07: 8 mg via INTRAVENOUS

## 2017-02-07 MED ORDER — CELECOXIB 200 MG PO CAPS
ORAL_CAPSULE | ORAL | Status: AC
  Filled 2017-02-07: qty 1

## 2017-02-07 MED ORDER — EPHEDRINE SULFATE 50 MG/ML IJ SOLN
INTRAMUSCULAR | Status: DC | PRN
  Administered 2017-02-07: 10 mg via INTRAVENOUS

## 2017-02-07 MED ORDER — ACETAMINOPHEN 500 MG PO TABS
ORAL_TABLET | ORAL | Status: AC
  Filled 2017-02-07: qty 2

## 2017-02-07 SURGICAL SUPPLY — 66 items
ADH SKN CLS APL DERMABOND .7 (GAUZE/BANDAGES/DRESSINGS) ×1
APL SKNCLS STERI-STRIP NONHPOA (GAUZE/BANDAGES/DRESSINGS)
APPLIER CLIP 9.375 MED OPEN (MISCELLANEOUS)
APR CLP MED 9.3 20 MLT OPN (MISCELLANEOUS)
BENZOIN TINCTURE PRP APPL 2/3 (GAUZE/BANDAGES/DRESSINGS) IMPLANT
BINDER BREAST LRG (GAUZE/BANDAGES/DRESSINGS) ×2 IMPLANT
BINDER BREAST MEDIUM (GAUZE/BANDAGES/DRESSINGS) IMPLANT
BINDER BREAST XLRG (GAUZE/BANDAGES/DRESSINGS) IMPLANT
BINDER BREAST XXLRG (GAUZE/BANDAGES/DRESSINGS) IMPLANT
BLADE HEX COATED 2.75 (ELECTRODE) ×3 IMPLANT
BLADE SURG 10 STRL SS (BLADE) IMPLANT
BLADE SURG 15 STRL LF DISP TIS (BLADE) ×1 IMPLANT
BLADE SURG 15 STRL SS (BLADE) ×3
CANISTER SUC SOCK COL 7IN (MISCELLANEOUS) IMPLANT
CANISTER SUCT 1200ML W/VALVE (MISCELLANEOUS) ×3 IMPLANT
CHLORAPREP W/TINT 26ML (MISCELLANEOUS) ×3 IMPLANT
CLIP APPLIE 9.375 MED OPEN (MISCELLANEOUS) IMPLANT
CLOSURE WOUND 1/2 X4 (GAUZE/BANDAGES/DRESSINGS)
COVER BACK TABLE 60X90IN (DRAPES) ×3 IMPLANT
COVER MAYO STAND STRL (DRAPES) ×3 IMPLANT
COVER PROBE W GEL 5X96 (DRAPES) ×3 IMPLANT
DECANTER SPIKE VIAL GLASS SM (MISCELLANEOUS) IMPLANT
DERMABOND ADVANCED (GAUZE/BANDAGES/DRESSINGS) ×2
DERMABOND ADVANCED .7 DNX12 (GAUZE/BANDAGES/DRESSINGS) ×1 IMPLANT
DEVICE DUBIN W/COMP PLATE 8390 (MISCELLANEOUS) ×3 IMPLANT
DRAPE LAPAROSCOPIC ABDOMINAL (DRAPES) ×3 IMPLANT
DRAPE UTILITY XL STRL (DRAPES) ×3 IMPLANT
DRSG PAD ABDOMINAL 8X10 ST (GAUZE/BANDAGES/DRESSINGS) ×2 IMPLANT
ELECT REM PT RETURN 9FT ADLT (ELECTROSURGICAL) ×3
ELECTRODE REM PT RTRN 9FT ADLT (ELECTROSURGICAL) ×1 IMPLANT
GAUZE SPONGE 4X4 12PLY STRL LF (GAUZE/BANDAGES/DRESSINGS) IMPLANT
GLOVE BIOGEL PI IND STRL 7.0 (GLOVE) IMPLANT
GLOVE BIOGEL PI INDICATOR 7.0 (GLOVE) ×2
GLOVE EUDERMIC 7 POWDERFREE (GLOVE) ×5 IMPLANT
GLOVE SURG SS PI 7.0 STRL IVOR (GLOVE) ×2 IMPLANT
GOWN STRL REUS W/ TWL LRG LVL3 (GOWN DISPOSABLE) ×1 IMPLANT
GOWN STRL REUS W/ TWL XL LVL3 (GOWN DISPOSABLE) ×1 IMPLANT
GOWN STRL REUS W/TWL LRG LVL3 (GOWN DISPOSABLE)
GOWN STRL REUS W/TWL XL LVL3 (GOWN DISPOSABLE) ×6
ILLUMINATOR WAVEGUIDE N/F (MISCELLANEOUS) IMPLANT
KIT MARKER MARGIN INK (KITS) ×3 IMPLANT
LIGHT WAVEGUIDE WIDE FLAT (MISCELLANEOUS) IMPLANT
NDL HYPO 25X1 1.5 SAFETY (NEEDLE) ×1 IMPLANT
NEEDLE HYPO 25X1 1.5 SAFETY (NEEDLE) ×3 IMPLANT
NS IRRIG 1000ML POUR BTL (IV SOLUTION) ×3 IMPLANT
PACK BASIN DAY SURGERY FS (CUSTOM PROCEDURE TRAY) ×3 IMPLANT
PENCIL BUTTON HOLSTER BLD 10FT (ELECTRODE) ×3 IMPLANT
SHEET MEDIUM DRAPE 40X70 STRL (DRAPES) IMPLANT
SLEEVE SCD COMPRESS KNEE MED (MISCELLANEOUS) ×3 IMPLANT
SPONGE LAP 18X18 X RAY DECT (DISPOSABLE) ×2 IMPLANT
SPONGE LAP 4X18 X RAY DECT (DISPOSABLE) ×3 IMPLANT
STRIP CLOSURE SKIN 1/2X4 (GAUZE/BANDAGES/DRESSINGS) IMPLANT
SUT ETHILON 3 0 FSL (SUTURE) IMPLANT
SUT MNCRL AB 4-0 PS2 18 (SUTURE) ×3 IMPLANT
SUT SILK 2 0 SH (SUTURE) ×3 IMPLANT
SUT VIC AB 2-0 CT1 27 (SUTURE)
SUT VIC AB 2-0 CT1 TAPERPNT 27 (SUTURE) IMPLANT
SUT VIC AB 3-0 SH 27 (SUTURE)
SUT VIC AB 3-0 SH 27X BRD (SUTURE) IMPLANT
SUT VICRYL 3-0 CR8 SH (SUTURE) ×3 IMPLANT
SYR 10ML LL (SYRINGE) ×3 IMPLANT
TOWEL OR 17X24 6PK STRL BLUE (TOWEL DISPOSABLE) ×3 IMPLANT
TOWEL OR NON WOVEN STRL DISP B (DISPOSABLE) ×2 IMPLANT
TUBE CONNECTING 20'X1/4 (TUBING) ×1
TUBE CONNECTING 20X1/4 (TUBING) ×2 IMPLANT
YANKAUER SUCT BULB TIP NO VENT (SUCTIONS) ×3 IMPLANT

## 2017-02-07 NOTE — Interval H&P Note (Signed)
History and Physical Interval Note:  02/07/2017 9:14 AM  Sherry Allen  has presented today for surgery, with the diagnosis of left breast abnormal mammogram  The various methods of treatment have been discussed with the patient and family. After consideration of risks, benefits and other options for treatment, the patient has consented to  Procedure(s): BREAST LUMPECTOMY WITH RADIOACTIVE SEED LOCALIZATION (Left) as a surgical intervention .  The patient's history has been reviewed, patient examined, no change in status, stable for surgery.  I have reviewed the patient's chart and labs.  Questions were answered to the patient's satisfaction.     Adin Hector

## 2017-02-07 NOTE — Anesthesia Postprocedure Evaluation (Signed)
Anesthesia Post Note  Patient: Sherry Allen  Procedure(s) Performed: BREAST LUMPECTOMY WITH RADIOACTIVE SEED LOCALIZATION (Left Breast)     Patient location during evaluation: PACU Anesthesia Type: General Level of consciousness: awake and alert Pain management: pain level controlled Vital Signs Assessment: post-procedure vital signs reviewed and stable Respiratory status: spontaneous breathing, nonlabored ventilation, respiratory function stable and patient connected to nasal cannula oxygen Cardiovascular status: blood pressure returned to baseline and stable Postop Assessment: no apparent nausea or vomiting Anesthetic complications: no    Last Vitals:  Vitals:   02/07/17 0851 02/07/17 1046  BP: 103/69 (!) 103/37  Pulse: (!) 55 86  Resp: 16 12  Temp: 36.4 C (!) 36.4 C  SpO2: 100% 100%    Last Pain:  Vitals:   02/07/17 1046  TempSrc:   PainSc: 0-No pain                 Porshe Fleagle
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
## 2017-02-07 NOTE — Discharge Instructions (Signed)
[facility] °Office Phone Number [phone] ° °BREAST BIOPSY/ PARTIAL MASTECTOMY: POST OP INSTRUCTIONS ° °Always review your discharge instruction sheet given to you by the facility where your surgery was performed. ° °IF YOU HAVE DISABILITY OR FAMILY LEAVE FORMS, YOU MUST BRING THEM TO THE OFFICE FOR PROCESSING.  DO NOT GIVE THEM TO YOUR DOCTOR. ° °1. A prescription for pain medication may be given to you upon discharge.  Take your pain medication as prescribed, if needed.  If narcotic pain medicine is not needed, then you may take acetaminophen (Tylenol) or ibuprofen (Advil) as needed. °2. Take your usually prescribed medications unless otherwise directed °3. If you need a refill on your pain medication, please contact your pharmacy.  They will contact our office to request authorization.  Prescriptions will not be filled after 5pm or on week-ends. °4. You should eat very light the first 24 hours after surgery, such as soup, crackers, pudding, etc.  Resume your normal diet the day after surgery. °5. Most patients will experience some swelling and bruising in the breast.  Ice packs and a good support bra will help.  Swelling and bruising can take several days to resolve.  °6. It is common to experience some constipation if taking pain medication after surgery.  Increasing fluid intake and taking a stool softener will usually help or prevent this problem from occurring.  A mild laxative (Milk of Magnesia or Miralax) should be taken according to package directions if there are no bowel movements after 48 hours. °7. Unless discharge instructions indicate otherwise, you may remove your bandages 24-48 hours after surgery, and you may shower at that time.  You may have steri-strips (small skin tapes) in place directly over the incision.  These strips should be left on the skin for 7-10 days.  If your surgeon used skin glue on the incision, you may shower in 24 hours.  The glue will flake off over the  next 2-3 weeks.  Any sutures or staples will be removed at the office during your follow-up visit. °8. ACTIVITIES:  You may resume regular daily activities (gradually increasing) beginning the next day.  Wearing a good support bra or sports bra minimizes pain and swelling.  You may have sexual intercourse when it is comfortable. °a. You may drive when you no longer are taking prescription pain medication, you can comfortably wear a seatbelt, and you can safely maneuver your car and apply brakes. °b. RETURN TO WORK:  ______________________________________________________________________________________ °9. You should see your doctor in the office for a follow-up appointment approximately two weeks after your surgery.  Your doctor’s nurse will typically make your follow-up appointment when she calls you with your pathology report.  Expect your pathology report 2-3 business days after your surgery.  You may call to check if you do not hear from us after three days. °10. OTHER INSTRUCTIONS: _______________________________________________________________________________________________ _____________________________________________________________________________________________________________________________________ °_____________________________________________________________________________________________________________________________________ °_____________________________________________________________________________________________________________________________________ ° °WHEN TO CALL YOUR DOCTOR: °1. Fever over 101.0 °2. Nausea and/or vomiting. °3. Extreme swelling or bruising. °4. Continued bleeding from incision. °5. Increased pain, redness, or drainage from the incision. ° °The clinic staff is available to answer your questions during regular business hours.  Please don’t hesitate to call and ask to speak to one of the nurses for clinical concerns.  If you have a medical emergency, go to the nearest  emergency room or call 911.  A surgeon from [facility] is always on call at the hospital. ° °For further questions, please visit [email or web address]  ° ° ° ° °  Post Anesthesia Home Care Instructions ° °Activity: °Get plenty of rest for the remainder of the day. A responsible individual must stay with you for 24 hours following the procedure.  °For the next 24 hours, DO NOT: °-Drive a car °-Operate machinery °-Drink alcoholic beverages °-Take any medication unless instructed by your physician °-Make any legal decisions or sign important papers. ° °Meals: °Start with liquid foods such as gelatin or soup. Progress to regular foods as tolerated. Avoid greasy, spicy, heavy foods. If nausea and/or vomiting occur, drink only clear liquids until the nausea and/or vomiting subsides. Call your physician if vomiting continues. ° °Special Instructions/Symptoms: °Your throat may feel dry or sore from the anesthesia or the breathing tube placed in your throat during surgery. If this causes discomfort, gargle with warm salt water. The discomfort should disappear within 24 hours. ° °If you had a scopolamine patch placed behind your ear for the management of post- operative nausea and/or vomiting: ° °1. The medication in the patch is effective for 72 hours, after which it should be removed.  Wrap patch in a tissue and discard in the trash. Wash hands thoroughly with soap and water. °2. You may remove the patch earlier than 72 hours if you experience unpleasant side effects which may include dry mouth, dizziness or visual disturbances. °3. Avoid touching the patch. Wash your hands with soap and water after contact with the patch. °  ° °

## 2017-02-07 NOTE — Transfer of Care (Signed)
Immediate Anesthesia Transfer of Care Note  Patient: Sherry Allen  Procedure(s) Performed: BREAST LUMPECTOMY WITH RADIOACTIVE SEED LOCALIZATION (Left Breast)  Patient Location: PACU  Anesthesia Type:General  Level of Consciousness: awake, alert  and oriented  Airway & Oxygen Therapy: Patient Spontanous Breathing and Patient connected to face mask oxygen  Post-op Assessment: Report given to RN and Post -op Vital signs reviewed and stable  Post vital signs: Reviewed and stable  Last Vitals:  Vitals:   02/07/17 0851  BP: 103/69  Pulse: (!) 55  Resp: 16  Temp: 36.4 C  SpO2: 100%    Last Pain:  Vitals:   02/07/17 0851  TempSrc: Oral  PainSc: 0-No pain      Patients Stated Pain Goal: 3 (95/18/84 1660)  Complications: No apparent anesthesia complications

## 2017-02-07 NOTE — Anesthesia Procedure Notes (Signed)
Procedure Name: Intubation Performed by: Verita Lamb Pre-anesthesia Checklist: Patient identified, Emergency Drugs available, Suction available, Patient being monitored and Timeout performed Patient Re-evaluated:Patient Re-evaluated prior to induction Oxygen Delivery Method: Circle system utilized Preoxygenation: Pre-oxygenation with 100% oxygen Induction Type: IV induction Ventilation: Mask ventilation without difficulty Laryngoscope Size: Mac and 3 Grade View: Grade I Tube type: Oral Number of attempts: 1 Placement Confirmation: ETT inserted through vocal cords under direct vision,  positive ETCO2,  CO2 detector and breath sounds checked- equal and bilateral Secured at: 21 cm Tube secured with: Tape Dental Injury: Teeth and Oropharynx as per pre-operative assessment

## 2017-02-08 ENCOUNTER — Encounter (HOSPITAL_BASED_OUTPATIENT_CLINIC_OR_DEPARTMENT_OTHER): Payer: Self-pay | Admitting: General Surgery

## 2017-02-08 NOTE — Progress Notes (Signed)
Inform patient of Pathology report,. There is no cancer, just complex sclerosing lesion and hyperplasia. Will discuss with her in detail in office. Let me know you reached her.   hmni

## 2017-04-04 ENCOUNTER — Ambulatory Visit: Payer: BLUE CROSS/BLUE SHIELD | Admitting: Family Medicine

## 2017-04-11 ENCOUNTER — Encounter: Payer: Self-pay | Admitting: Family Medicine

## 2017-04-11 ENCOUNTER — Ambulatory Visit: Payer: 59 | Admitting: Family Medicine

## 2017-04-11 VITALS — BP 98/68 | HR 63 | Temp 97.8°F | Resp 18 | Wt 164.8 lb

## 2017-04-11 DIAGNOSIS — Z23 Encounter for immunization: Secondary | ICD-10-CM | POA: Diagnosis not present

## 2017-04-11 DIAGNOSIS — M79604 Pain in right leg: Secondary | ICD-10-CM

## 2017-04-11 DIAGNOSIS — N649 Disorder of breast, unspecified: Secondary | ICD-10-CM

## 2017-04-11 DIAGNOSIS — R05 Cough: Secondary | ICD-10-CM

## 2017-04-11 DIAGNOSIS — K219 Gastro-esophageal reflux disease without esophagitis: Secondary | ICD-10-CM

## 2017-04-11 DIAGNOSIS — M545 Low back pain: Secondary | ICD-10-CM | POA: Diagnosis not present

## 2017-04-11 DIAGNOSIS — R059 Cough, unspecified: Secondary | ICD-10-CM

## 2017-04-11 MED ORDER — ALPRAZOLAM 0.25 MG PO TABS
0.2500 mg | ORAL_TABLET | Freq: Two times a day (BID) | ORAL | 1 refills | Status: DC | PRN
Start: 2017-04-11 — End: 2017-11-18

## 2017-04-11 MED ORDER — PANTOPRAZOLE SODIUM 40 MG PO TBEC: 40.0000 mg | DELAYED_RELEASE_TABLET | Freq: Every day | ORAL | 3 refills | Status: DC

## 2017-04-11 MED ORDER — TIZANIDINE HCL 4 MG PO TABS: 4.0000 mg | ORAL_TABLET | Freq: Two times a day (BID) | ORAL | 1 refills | Status: DC | PRN

## 2017-04-11 NOTE — Assessment & Plan Note (Signed)
Encouraged moist heat and gentle stretching as tolerated. May try NSAIDs and prescription meds as directed and report if symptoms worsen or seek immediate care 

## 2017-04-11 NOTE — Patient Instructions (Signed)
Try [employer] herb tea or lozenges for the cough. Cough, Adult A cough helps to clear your throat and lungs. A cough may last only 2-3 weeks (acute), or it may last longer than 8 weeks (chronic). Many different things can cause a cough. A cough may be a sign of an illness or another medical condition. Follow these instructions at home:  Pay attention to any changes in your cough.  Take medicines only as told by your doctor. ? If you were prescribed an antibiotic medicine, take it as told by your doctor. Do not stop taking it even if you start to feel better. ? Talk with your doctor before you try using a cough medicine.  Drink enough fluid to keep your pee (urine) clear or pale yellow.  If the air is dry, use a cold steam vaporizer or humidifier in your home.  Stay away from things that make you cough at work or at home.  If your cough is worse at night, try using extra pillows to raise your head up higher while you sleep.  Do not smoke, and try not to be around smoke. If you need help quitting, ask your doctor.  Do not have caffeine.  Do not drink alcohol.  Rest as needed. Contact a doctor if:  You have new problems (symptoms).  You cough up yellow fluid (pus).  Your cough does not get better after 2-3 weeks, or your cough gets worse.  Medicine does not help your cough and you are not sleeping well.  You have pain that gets worse or pain that is not helped with medicine.  You have a fever.  You are losing weight and you do not know why.  You have night sweats. Get help right away if:  You cough up blood.  You have trouble breathing.  Your heartbeat is very fast. This information is not intended to replace advice given to you by your health care provider. Make sure you discuss any questions you have with your health care provider. Document Released: 12/24/2010 Document Revised: 09/18/2015 Document Reviewed: 06/19/2014 Elsevier Interactive Patient Education   [employer].

## 2017-04-11 NOTE — Assessment & Plan Note (Signed)
Avoid offending foods, start probiotics. Do not eat large meals in late evening and consider raising head of bed.  

## 2017-04-11 NOTE — Assessment & Plan Note (Signed)
S/p lumpectomy benign lesion

## 2017-04-11 NOTE — Assessment & Plan Note (Signed)
Tolerable but still present in the morning. Using Protonix in am and Zyrtec daily. Has been scoped by GI and seen by ENT and no pathology noted. Can try Lucky Rathke to help
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
## 2017-07-18 LAB — HM PAP SMEAR: HM PAP: NORMAL

## 2017-10-06 ENCOUNTER — Ambulatory Visit (INDEPENDENT_AMBULATORY_CARE_PROVIDER_SITE_OTHER): Payer: 59 | Admitting: Family Medicine

## 2017-10-06 ENCOUNTER — Ambulatory Visit (HOSPITAL_BASED_OUTPATIENT_CLINIC_OR_DEPARTMENT_OTHER)
Admission: RE | Admit: 2017-10-06 | Discharge: 2017-10-06 | Disposition: A | Discharge status: Home / Self Care | Payer: 59 | Source: Ambulatory Visit | Attending: Family Medicine | Admitting: Family Medicine

## 2017-10-06 ENCOUNTER — Encounter: Payer: Self-pay | Admitting: Family Medicine

## 2017-10-06 VITALS — BP 98/70 | HR 57 | Temp 97.9°F | Resp 18 | Ht 67.0 in | Wt 159.6 lb

## 2017-10-06 DIAGNOSIS — K219 Gastro-esophageal reflux disease without esophagitis: Secondary | ICD-10-CM | POA: Diagnosis not present

## 2017-10-06 DIAGNOSIS — G8929 Other chronic pain: Secondary | ICD-10-CM | POA: Insufficient documentation

## 2017-10-06 DIAGNOSIS — Z0000 Encounter for general adult medical examination without abnormal findings: Secondary | ICD-10-CM | POA: Diagnosis not present

## 2017-10-06 DIAGNOSIS — M5441 Lumbago with sciatica, right side: Secondary | ICD-10-CM | POA: Diagnosis present

## 2017-10-06 DIAGNOSIS — M5442 Lumbago with sciatica, left side: Secondary | ICD-10-CM | POA: Diagnosis not present

## 2017-10-06 DIAGNOSIS — M25552 Pain in left hip: Secondary | ICD-10-CM

## 2017-10-06 DIAGNOSIS — M12552 Traumatic arthropathy, left hip: Secondary | ICD-10-CM | POA: Diagnosis not present

## 2017-10-06 LAB — COMPREHENSIVE METABOLIC PANEL
ALT: 26 U/L (ref 0–35)
AST: 21 U/L (ref 0–37)
Albumin: 4.5 g/dL (ref 3.5–5.2)
Alkaline Phosphatase: 56 U/L (ref 39–117)
BILIRUBIN TOTAL: 0.4 mg/dL (ref 0.2–1.2)
BUN: 9 mg/dL (ref 6–23)
CALCIUM: 9.9 mg/dL (ref 8.4–10.5)
CHLORIDE: 100 meq/L (ref 96–112)
CO2: 30 meq/L (ref 19–32)
Creatinine, Ser: 0.64 mg/dL (ref 0.40–1.20)
GFR: 105.88 mL/min (ref >60.00)
GLUCOSE: 100 mg/dL — AB (ref 70–99)
POTASSIUM: 4.7 meq/L (ref 3.5–5.1)
Sodium: 136 mEq/L (ref 135–145)
Total Protein: 7.3 g/dL (ref 6.0–8.3)

## 2017-10-06 LAB — CBC
HCT: 40.8 % (ref 36.0–46.0)
HEMOGLOBIN: 14 g/dL (ref 12.0–15.0)
MCHC: 34.3 g/dL (ref 30.0–36.0)
MCV: 86.7 fl (ref 78.0–100.0)
PLATELETS: 316 10*3/uL (ref 150.0–400.0)
RBC: 4.71 Mil/uL (ref 3.87–5.11)
RDW: 12.7 % (ref 11.5–15.5)
WBC: 5.9 10*3/uL (ref 4.0–10.5)

## 2017-10-06 LAB — LIPID PANEL
CHOL/HDL RATIO: 3
Cholesterol: 168 mg/dL (ref 0–200)
HDL: 60.9 mg/dL (ref >39.00)
LDL Cholesterol: 92 mg/dL (ref 0–99)
NONHDL: 106.78
TRIGLYCERIDES: 76 mg/dL (ref 0.0–149.0)
VLDL: 15.2 mg/dL (ref 0.0–40.0)

## 2017-10-06 LAB — TSH: TSH: 1.77 u[IU]/mL (ref 0.35–4.50)

## 2017-10-06 NOTE — Patient Instructions (Signed)
Preventive Care 40-64 Years, Female Preventive care refers to lifestyle choices and visits with your health care provider that can promote health and wellness. What does preventive care include?  A yearly physical exam. This is also called an annual well check.  Dental exams once or twice a year.  Routine eye exams. Ask your health care provider how often you should have your eyes checked.  Personal lifestyle choices, including: ? Daily care of your teeth and gums. ? Regular physical activity. ? Eating a healthy diet. ? Avoiding tobacco and drug use. ? Limiting alcohol use. ? Practicing safe sex. ? Taking low-dose aspirin daily starting at age 58. ? Taking vitamin and mineral supplements as recommended by your health care provider. What happens during an annual well check? The services and screenings done by your health care provider during your annual well check will depend on your age, overall health, lifestyle risk factors, and family history of disease. Counseling Your health care provider may ask you questions about your:  Alcohol use.  Tobacco use.  Drug use.  Emotional well-being.  Home and relationship well-being.  Sexual activity.  Eating habits.  Work and work Statistician.  Method of birth control.  Menstrual cycle.  Pregnancy history.  Screening You may have the following tests or measurements:  Height, weight, and BMI.  Blood pressure.  Lipid and cholesterol levels. These may be checked every 5 years, or more frequently if you are over 81 years old.  Skin check.  Lung cancer screening. You may have this screening every year starting at age 78 if you have a 30-pack-year history of smoking and currently smoke or have quit within the past 15 years.  Fecal occult blood test (FOBT) of the stool. You may have this test every year starting at age 65.  Flexible sigmoidoscopy or colonoscopy. You may have a sigmoidoscopy every 5 years or a colonoscopy  every 10 years starting at age 30.  Hepatitis C blood test.  Hepatitis B blood test.  Sexually transmitted disease (STD) testing.  Diabetes screening. This is done by checking your blood sugar (glucose) after you have not eaten for a while (fasting). You may have this done every 1-3 years.  Mammogram. This may be done every 1-2 years. Talk to your health care provider about when you should start having regular mammograms. This may depend on whether you have a family history of breast cancer.  BRCA-related cancer screening. This may be done if you have a family history of breast, ovarian, tubal, or peritoneal cancers.  Pelvic exam and Pap test. This may be done every 3 years starting at age 80. Starting at age 36, this may be done every 5 years if you have a Pap test in combination with an HPV test.  Bone density scan. This is done to screen for osteoporosis. You may have this scan if you are at high risk for osteoporosis.  Discuss your test results, treatment options, and if necessary, the need for more tests with your health care provider. Vaccines Your health care provider may recommend certain vaccines, such as:  Influenza vaccine. This is recommended every year.  Tetanus, diphtheria, and acellular pertussis (Tdap, Td) vaccine. You may need a Td booster every 10 years.  Varicella vaccine. You may need this if you have not been vaccinated.  Zoster vaccine. You may need this after age 5.  Measles, mumps, and rubella (MMR) vaccine. You may need at least one dose of MMR if you were born in  1957 or later. You may also need a second dose.  Pneumococcal 13-valent conjugate (PCV13) vaccine. You may need this if you have certain conditions and were not previously vaccinated.  Pneumococcal polysaccharide (PPSV23) vaccine. You may need one or two doses if you smoke cigarettes or if you have certain conditions.  Meningococcal vaccine. You may need this if you have certain  conditions.  Hepatitis A vaccine. You may need this if you have certain conditions or if you travel or work in places where you may be exposed to hepatitis A.  Hepatitis B vaccine. You may need this if you have certain conditions or if you travel or work in places where you may be exposed to hepatitis B.  Haemophilus influenzae type b (Hib) vaccine. You may need this if you have certain conditions.  Talk to your health care provider about which screenings and vaccines you need and how often you need them. This information is not intended to replace advice given to you by your health care provider. Make sure you discuss any questions you have with your health care provider. Document Released: 05/09/2015 Document Revised: 12/31/2015 Document Reviewed: 02/11/2015 Elsevier Interactive Patient Education  2018 Elsevier Inc.  
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
## 2017-10-06 NOTE — Assessment & Plan Note (Signed)
Patient encouraged to maintain heart healthy diet, regular exercise, adequate sleep. Consider daily probiotics. Take medications as prescribed. Labs reviewed 

## 2017-10-06 NOTE — Assessment & Plan Note (Addendum)
Avoid offending foods, start probiotics. Do not eat large meals in late evening and consider raising head of bed. Has been bad enough in the past she has had an upper endoscopy roughly 7 years ago with some biopsies which were benign. Was recommended 2 years ago. Symptoms continue will reestablish with GI due to dyspepsia dn to the point that she will vomit suddenly. Does better if she eats better and smaller meals. Referred to Dr Lyndel Safe here in [facility] for evaluation

## 2017-10-07 ENCOUNTER — Telehealth: Payer: Self-pay | Admitting: Internal Medicine

## 2017-10-07 NOTE — Telephone Encounter (Signed)
Certainly okay with me

## 2017-10-07 NOTE — Telephone Encounter (Signed)
More than happy to accept the patient

## 2017-10-07 NOTE — Telephone Encounter (Signed)
Dr. Gupta will you accept this patient? 

## 2017-10-08 NOTE — Assessment & Plan Note (Signed)
Patient xray of left hipo shows significant reconstruction and hardware and she is now having increasing weakness and pain in hip and left leg. She is interested in repeat imaging and referral for further consideration will proceed.

## 2017-10-12 NOTE — Telephone Encounter (Signed)
Pt is scheduled with Dr. Lyndel Safe for 11-18-17

## 2017-10-13 ENCOUNTER — Telehealth: Payer: Self-pay | Admitting: *Deleted

## 2017-10-13 NOTE — Telephone Encounter (Signed)
Received Medical records from [facility]; forwarded to provider/SLS 06/20

## 2017-10-21 ENCOUNTER — Encounter: Payer: Self-pay | Admitting: Family Medicine

## 2017-11-10 ENCOUNTER — Other Ambulatory Visit: Payer: Self-pay | Admitting: Obstetrics and Gynecology

## 2017-11-10 DIAGNOSIS — Z1231 Encounter for screening mammogram for malignant neoplasm of breast: Secondary | ICD-10-CM

## 2017-11-18 ENCOUNTER — Ambulatory Visit (INDEPENDENT_AMBULATORY_CARE_PROVIDER_SITE_OTHER): Payer: 59 | Admitting: Gastroenterology

## 2017-11-18 ENCOUNTER — Encounter: Payer: Self-pay | Admitting: Gastroenterology

## 2017-11-18 VITALS — BP 112/74 | HR 72 | Ht 67.0 in | Wt 159.0 lb

## 2017-11-18 DIAGNOSIS — R05 Cough: Secondary | ICD-10-CM | POA: Diagnosis not present

## 2017-11-18 DIAGNOSIS — R059 Cough, unspecified: Secondary | ICD-10-CM

## 2017-11-18 DIAGNOSIS — K219 Gastro-esophageal reflux disease without esophagitis: Secondary | ICD-10-CM

## 2017-11-18 MED ORDER — DEXLANSOPRAZOLE 60 MG PO CPDR: 60.0000 mg | DELAYED_RELEASE_CAPSULE | Freq: Every day | ORAL | 11 refills | Status: DC

## 2017-11-18 NOTE — Patient Instructions (Addendum)
If you are age 47 or older, your body mass index should be between 23-30. Your Body mass index is 24.9 kg/m. If this is out of the aforementioned range listed, please consider follow up with your Primary Care Provider.  If you are age 57 or younger, your body mass index should be between 19-25. Your Body mass index is 24.9 kg/m. If this is out of the aformentioned range listed, please consider follow up with your Primary Care Provider.   You have been scheduled for an endoscopy. Please follow written instructions given to you at your visit today. If you use inhalers (even only as needed), please bring them with you on the day of your procedure. Your physician has requested that you go to www.startemmi.com and enter the access code given to you at your visit today. This web site gives a general overview about your procedure. However, you should still follow specific instructions given to you by our office regarding your preparation for the procedure.    You have been scheduled for a Barium Esophogram at [facility] (1st floor of the hospital) on 12/01/17 at 10:30am. Please arrive 15 minutes prior to your appointment for registration. Make certain not to have anything to eat or drink 6 hours prior to your test. If you need to reschedule for any reason, please contact radiology at [phone] to do so. __________________________________________________________________ A barium swallow is an examination that concentrates on views of the esophagus. This tends to be a double contrast exam (barium and two liquids which, when combined, create a gas to distend the wall of the oesophagus) or single contrast (non-ionic iodine based). The study is usually tailored to your symptoms so a good history is essential. Attention is paid during the study to the form, structure and configuration of the esophagus, looking for functional disorders (such as aspiration, dysphagia, achalasia, motility and  reflux) EXAMINATION You may be asked to change into a gown, depending on the type of swallow being performed. A radiologist and radiographer will perform the procedure. The radiologist will advise you of the type of contrast selected for your procedure and direct you during the exam. You will be asked to stand, sit or lie in several different positions and to hold a small amount of fluid in your mouth before being asked to swallow while the imaging is performed .In some instances you may be asked to swallow barium coated marshmallows to assess the motility of a solid food bolus. The exam can be recorded as a digital or video fluoroscopy procedure. POST PROCEDURE It will take 1-2 days for the barium to pass through your system. To facilitate this, it is important, unless otherwise directed, to increase your fluids for the next 24-48hrs and to resume your normal diet.  This test typically takes about 30 minutes to perform. _______________________________________________________________________________  We have sent the following medications to your pharmacy for you to pick up at your convenience: Dexilant 60 mg once daily.   Thank you,  Dr. Jackquline Denmark
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
## 2017-12-01 ENCOUNTER — Ambulatory Visit (HOSPITAL_COMMUNITY)
Admission: RE | Admit: 2017-12-01 | Discharge: 2017-12-01 | Disposition: A | Discharge status: Home / Self Care | Payer: 59 | Source: Ambulatory Visit | Attending: Gastroenterology | Admitting: Gastroenterology

## 2017-12-01 DIAGNOSIS — R05 Cough: Secondary | ICD-10-CM | POA: Insufficient documentation

## 2017-12-01 DIAGNOSIS — K219 Gastro-esophageal reflux disease without esophagitis: Secondary | ICD-10-CM | POA: Insufficient documentation

## 2017-12-01 DIAGNOSIS — R059 Cough, unspecified: Secondary | ICD-10-CM

## 2017-12-15 ENCOUNTER — Ambulatory Visit
Admission: RE | Admit: 2017-12-15 | Discharge: 2017-12-15 | Disposition: A | Discharge status: Home / Self Care | Payer: 59 | Source: Ambulatory Visit | Attending: Obstetrics and Gynecology | Admitting: Obstetrics and Gynecology

## 2017-12-15 DIAGNOSIS — Z1231 Encounter for screening mammogram for malignant neoplasm of breast: Secondary | ICD-10-CM

## 2018-01-09 ENCOUNTER — Encounter: Payer: Self-pay | Admitting: Gastroenterology

## 2018-01-13 ENCOUNTER — Encounter: Payer: Self-pay | Admitting: Allergy

## 2018-01-13 ENCOUNTER — Ambulatory Visit: Payer: 59 | Admitting: Allergy

## 2018-01-13 VITALS — BP 102/62 | HR 92 | Resp 16 | Ht 67.0 in | Wt 158.8 lb

## 2018-01-13 DIAGNOSIS — J3089 Other allergic rhinitis: Secondary | ICD-10-CM

## 2018-01-13 DIAGNOSIS — K219 Gastro-esophageal reflux disease without esophagitis: Secondary | ICD-10-CM | POA: Diagnosis not present

## 2018-01-13 DIAGNOSIS — R05 Cough: Secondary | ICD-10-CM | POA: Diagnosis not present

## 2018-01-13 DIAGNOSIS — R053 Chronic cough: Secondary | ICD-10-CM

## 2018-01-13 MED ORDER — MONTELUKAST SODIUM 10 MG PO TABS: 10.0000 mg | ORAL_TABLET | Freq: Every day | ORAL | 5 refills | Status: DC

## 2018-01-13 MED ORDER — ALBUTEROL SULFATE HFA 108 (90 BASE) MCG/ACT IN AERS: 1.0000 | INHALATION_SPRAY | Freq: Four times a day (QID) | RESPIRATORY_TRACT | 1 refills | Status: DC | PRN

## 2018-01-13 MED ORDER — AZELASTINE-FLUTICASONE 137-50 MCG/ACT NA SUSP: 2.0000 | Freq: Every day | NASAL | 5 refills | Status: DC
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
## 2018-01-13 NOTE — Patient Instructions (Addendum)
Cough with post-nasal drainage and reflux   - environmental allergy skin testing today is positive to weed pollens, molds and dust mites   - common food allergens skin testing today is negative   - allergen avoidance measures discussed/handouts provided   - for post-nasal drainage recommend a trial of Dymista.  Dymista 1 spray each nostril twice a day.  This is a combination nasal spray with Flonase + Astelin (nasal antihistamine).  This helps with both nasal congestion and drainage.  If insurance does not cover this will prescribe Astelin separately to use.     - continue long-acting antihistamine like Zyrtec 10mg , Allegra 180mg  or Xyzal 5mg  daily   - re-start Singulair 10mg  daily   - continue Dexilant for reflux control and recommendations by Dr. Lyndel Safe   - you had improvement in your lung function testing after use of albuterol nebulizer treatment with subjective improvement in symptoms as well thus will prescribe albuterol.     - have access to albuterol inhaler 2 puffs every 4-6 hours as needed for cough/wheeze/shortness of breath/chest tightness.  May use 15-20 minutes prior to activity.   Monitor frequency of use.    Follow-up 3-4 months or sooner if needed

## 2018-01-20 ENCOUNTER — Encounter: Payer: Self-pay | Admitting: Gastroenterology

## 2018-01-20 ENCOUNTER — Ambulatory Visit (AMBULATORY_SURGERY_CENTER): Payer: 59 | Admitting: Gastroenterology

## 2018-01-20 VITALS — BP 101/61 | HR 62 | Temp 98.7°F | Resp 18 | Ht 67.0 in | Wt 159.0 lb

## 2018-01-20 DIAGNOSIS — R05 Cough: Secondary | ICD-10-CM | POA: Diagnosis not present

## 2018-01-20 DIAGNOSIS — K317 Polyp of stomach and duodenum: Secondary | ICD-10-CM | POA: Diagnosis not present

## 2018-01-20 DIAGNOSIS — K219 Gastro-esophageal reflux disease without esophagitis: Secondary | ICD-10-CM

## 2018-01-20 MED ORDER — SODIUM CHLORIDE 0.9 % IV SOLN: 500.0000 mL | Freq: Once | INTRAVENOUS | Status: DC

## 2018-01-20 NOTE — Op Note (Signed)
[facility] Patient Name: Sherry Allen Procedure Date: 01/20/2018 9:45 AM MRN: [id number] Endoscopist: Jackquline Denmark , MD Age: 47 Referring MD:  Date of Birth: [birth year] Gender: Female Account #: [id number] Procedure:                Upper GI endoscopy Indications:              Suspected esophageal reflux with chronic cough. Medicines:                Monitored Anesthesia Care Procedure:                Pre-Anesthesia Assessment:                           - Prior to the procedure, a History and Physical                            was performed, and patient medications and                            allergies were reviewed. The patient's tolerance of                            previous anesthesia was also reviewed. The risks                            and benefits of the procedure and the sedation                            options and risks were discussed with the patient.                            All questions were answered, and informed consent                            was obtained. Prior Anticoagulants: The patient has                            taken no previous anticoagulant or antiplatelet                            agents. ASA Grade Assessment: I - A normal, healthy                            patient. After reviewing the risks and benefits,                            the patient was deemed in satisfactory condition to                            undergo the procedure.                           After obtaining informed consent, the endoscope was  passed under direct vision. Throughout the                            procedure, the patient's blood pressure, pulse, and                            oxygen saturations were monitored continuously. The                            Endoscope was introduced through the mouth, and                            advanced to the second part of duodenum. The upper                            GI endoscopy was  accomplished without difficulty.                            The patient tolerated the procedure well. Scope In: Scope Out: Findings:                 Wide open intermittent Schatzki's ring was noted at                            GE junction 35 cm from the incisiors. The scope was                            withdrawn. Dilation was performed with a Maloney                            dilator with no resistance at 46 Fr. Biopsies were                            taken with a cold forceps for histology to r/o                            eosinophilic esophagitis from proximal mid and                            distal esophagus. Estimated blood loss was minimal.                           A small hiatal hernia was present.                           Localized mild inflammation characterized by                            erythema was found in the gastric antrum. Biopsies                            were taken with a cold forceps for histology.  6-8, 4 to 6 mm sessile polyps with no bleeding and                            no stigmata of recent bleeding were found in the                            gastric fundus. 3 polyps were removed with a cold                            biopsy forceps. Resection and retrieval were                            complete. Biopsies for histology were taken from                            the second portion of the duodenum with a cold                            forceps for evaluation of celiac disease. Estimated                            blood loss: none.                           The exam was otherwise without abnormality. Complications:            No immediate complications. Estimated Blood Loss:     Estimated blood loss: none. Impression:               - Schatzki's ring status post esophageal dilatation.                           - Small hiatal hernia.                           - Mild gastritis.                           - Gastric polyps. (s/p  polypectomy x 3). Recommendation:           - Patient has a contact number available for                            emergencies. The signs and symptoms of potential                            delayed complications were discussed with the                            patient. Return to normal activities tomorrow.                            Written discharge instructions were provided to the  patient.                           - Resume previous diet.                           - Continue Dexilant 60 mg by mouth once a day.                           - Await pathology results.                           - Return to my office in 12 weeks. Jackquline Denmark, MD 01/20/2018 10:23:53 AM This report has been signed electronically.

## 2018-01-20 NOTE — Patient Instructions (Signed)
HANDOUTS GIVEN FOR GASTRITIS AND STRICTURE  RESUME PREVIOUS DIET, CONTINUE DEXILANT 60 MG DAILY, RETURN TO OFFICE IN 12 WEEKS  YOU HAD AN ENDOSCOPIC PROCEDURE TODAY AT [place]:   Refer to the procedure report that was given to you for any specific questions about what was found during the examination.  If the procedure report does not answer your questions, please call your gastroenterologist to clarify.  If you requested that your care partner not be given the details of your procedure findings, then the procedure report has been included in a sealed envelope for you to review at your convenience later.  YOU SHOULD EXPECT: Some feelings of bloating in the abdomen. Passage of more gas than usual.  Walking can help get rid of the air that was put into your GI tract during the procedure and reduce the bloating. If you had a lower endoscopy (such as a colonoscopy or flexible sigmoidoscopy) you may notice spotting of blood in your stool or on the toilet paper. If you underwent a bowel prep for your procedure, you may not have a normal bowel movement for a few days.  Please Note:  You might notice some irritation and congestion in your nose or some drainage.  This is from the oxygen used during your procedure.  There is no need for concern and it should clear up in a day or so.  SYMPTOMS TO REPORT IMMEDIATELY:   Following upper endoscopy (EGD)  Vomiting of blood or coffee ground material  New chest pain or pain under the shoulder blades  Painful or persistently difficult swallowing  New shortness of breath  Fever of 100F or higher  Black, tarry-looking stools  For urgent or emergent issues, a gastroenterologist can be reached at any hour by calling [phone].   DIET:  We do recommend a small meal at first, but then you may proceed to your regular diet.  Drink plenty of fluids but you should avoid alcoholic beverages for 24 hours.  ACTIVITY:  You should plan to take it  easy for the rest of today and you should NOT DRIVE or use heavy machinery until tomorrow (because of the sedation medicines used during the test).    FOLLOW UP: Our staff will call the number listed on your records the next business day following your procedure to check on you and address any questions or concerns that you may have regarding the information given to you following your procedure. If we do not reach you, we will leave a message.  However, if you are feeling well and you are not experiencing any problems, there is no need to return our call.  We will assume that you have returned to your regular daily activities without incident.  If any biopsies were taken you will be contacted by phone or by letter within the next 1-3 weeks.  Please call us at [phone] if you have not heard about the biopsies in 3 weeks.    SIGNATURES/CONFIDENTIALITY: You and/or your care partner have signed paperwork which will be entered into your electronic medical record.  These signatures attest to the fact that that the information above on your After Visit Summary has been reviewed and is understood.  Full responsibility of the confidentiality of this discharge information lies with you and/or your care-partner.

## 2018-01-20 NOTE — Progress Notes (Signed)
Pt's states no medical or surgical changes since previsit or office visit. 

## 2018-01-20 NOTE — Progress Notes (Signed)
Report to PACU, RN, vss, BBS= Clear.  

## 2018-01-20 NOTE — Progress Notes (Signed)
Called to room to assist during endoscopic procedure.  Patient ID and intended procedure confirmed with present staff. Received instructions for my participation in the procedure from the performing physician.  

## 2018-01-23 ENCOUNTER — Telehealth: Payer: Self-pay

## 2018-01-23 NOTE — Telephone Encounter (Signed)
  Follow up Call-  Call back number 01/20/2018  Post procedure Call Back phone  # [phone]  Permission to leave phone message Yes  Some recent data might be hidden     Patient questions:  Do you have a fever, pain , or abdominal swelling? No. Pain Score  0 *  Have you tolerated food without any problems? Yes.    Have you been able to return to your normal activities? Yes.    Do you have any questions about your discharge instructions: Diet   No. Medications  No. Follow up visit  No.  Do you have questions or concerns about your Care? No.  Actions: * If pain score is 4 or above: No action needed, pain <4.

## 2018-01-24 ENCOUNTER — Encounter: Payer: Self-pay | Admitting: Gastroenterology

## 2018-03-30 ENCOUNTER — Telehealth: Payer: Self-pay | Admitting: Gastroenterology

## 2018-04-03 NOTE — Telephone Encounter (Signed)
I have sent a PA in on this medication.

## 2018-04-13 ENCOUNTER — Telehealth: Payer: Self-pay

## 2018-04-13 NOTE — Telephone Encounter (Signed)
Prior authorization requested for Dymista. This has been approved and sent to pharmacy. I am also sending a copy to be scanned to chart.

## 2018-04-21 ENCOUNTER — Ambulatory Visit: Payer: BLUE CROSS/BLUE SHIELD | Admitting: Allergy

## 2018-04-21 ENCOUNTER — Encounter: Payer: Self-pay | Admitting: Allergy

## 2018-04-21 VITALS — BP 98/62 | HR 68 | Resp 16

## 2018-04-21 DIAGNOSIS — R05 Cough: Secondary | ICD-10-CM

## 2018-04-21 DIAGNOSIS — K219 Gastro-esophageal reflux disease without esophagitis: Secondary | ICD-10-CM | POA: Diagnosis not present

## 2018-04-21 DIAGNOSIS — J3089 Other allergic rhinitis: Secondary | ICD-10-CM | POA: Diagnosis not present

## 2018-04-21 DIAGNOSIS — R058 Other specified cough: Secondary | ICD-10-CM

## 2018-04-21 NOTE — Patient Instructions (Addendum)
Cough with post-nasal drainage   - continue avoidance measures for weed pollens, molds and dust mites   - common food allergens skin testing was negative at initial visit   -continue Dymista.  Dymista 1 spray each nostril twice a day.  This is a combination nasal spray with Flonase + Astelin (nasal antihistamine).  This helps with both nasal congestion and drainage.  If insurance does not cover this will prescribe Astelin separately to use.  Sample provided today.      - continue long-acting antihistamine like Xyzal 5mg  daily   - continue Singulair 10mg  daily   - have access to albuterol inhaler 2 puffs every 4-6 hours as needed for cough/wheeze/shortness of breath/chest tightness.  May use 15-20 minutes prior to activity.   Monitor frequency of use.     Reflux   - continue Dexilant for reflux control and recommendations by Dr. Lyndel Safe     Follow-up 4-6 months or sooner if needed

## 2018-04-21 NOTE — Progress Notes (Signed)
Follow-up Note  RE: MATTINGLY FOUNTAINE MRN: [id number] DOB: August 22, 1970 Date of Office Visit: 04/21/2018   History of present illness: Sherry Allen is a 47 y.o. female presenting today for follow-up of cough with post-nasal drip and reflux.   She was last seen in the office on 01/13/18 by myself.  After her initial evaluation I recommend she state an long-acting antihistamine, singuliar, dexilant and albuterol.   She did try zyrtec but did not like this one and changed to xyzal which she states is effective.  She is taking xyzal daily.  She is taking singulair daily.  We did provided her with Dymista sample as last visit and she states this works very well for her nasal congestion/drainage however her insurance changed and it was no longer on formulary thus she is just using flonase at that time.  She also continues to take dexilant daily.  She has an appt with Dr. Lyndel Safe in GI on 05/05/18.  She states that her sinus/allergy symptoms seem to be much better control with regimen above but she is still having issues with esophageal spasms.  She states she can drink water and feel like her esophagus will spasm.   She states per Dr. Lyndel Safe she may need a "mild muscle relaxer".  She states with albuterol she has only used 5 times since last visit which has helped to stop coughing bouts.     Review of systems: Review of Systems  Constitutional: Negative for chills, fever and malaise/fatigue.  HENT: Negative for congestion, ear discharge, nosebleeds and sore throat.   Eyes: Negative for pain, discharge and redness.  Respiratory: Positive for cough. Negative for shortness of breath and wheezing.   Cardiovascular: Negative for chest pain.  Gastrointestinal: Positive for heartburn. Negative for abdominal pain, constipation, diarrhea, nausea and vomiting.  Musculoskeletal: Negative for joint pain.  Skin: Negative for itching and rash.  Neurological: Negative for headaches.    All other systems negative  unless noted above in HPI  Past medical/social/surgical/family history have been reviewed and are unchanged unless specifically indicated below.  No changes  Medication List: Allergies as of 04/21/2018      Reactions   Sulfa Antibiotics Hives   Adhesive [tape] Rash      Medication List       Accurate as of April 21, 2018 10:45 AM. Always use your most recent med list.        albuterol 108 (90 Base) MCG/ACT inhaler Commonly known as:  PROVENTIL HFA;VENTOLIN HFA Inhale 1-2 puffs into the lungs every 6 (six) hours as needed for wheezing or shortness of breath.   Azelastine-Fluticasone 137-50 MCG/ACT Susp Commonly known as:  DYMISTA Place 2 sprays into both nostrils daily.   dexlansoprazole 60 MG capsule Commonly known as:  DEXILANT Take 1 capsule (60 mg total) by mouth daily.   fluticasone 50 MCG/ACT nasal spray Commonly known as:  FLONASE 2 sprays by Each Nare route daily.   levocetirizine 5 MG tablet Commonly known as:  XYZAL Take 5 mg by mouth every evening.   montelukast 10 MG tablet Commonly known as:  SINGULAIR Take 1 tablet (10 mg total) by mouth at bedtime.   progesterone 100 MG capsule Commonly known as:  PROMETRIUM Take 100 mg by mouth daily.       Known medication allergies: Allergies  Allergen Reactions  . Sulfa Antibiotics Hives  . Adhesive [Tape] Rash     Physical examination: Blood pressure 98/62, pulse 68, resp. rate 16, SpO2  97 %.  General: Alert, interactive, in no acute distress. HEENT: PERRLA, TMs pearly gray, turbinates minimally edematous without discharge, post-pharynx non erythematous. Neck: Supple without lymphadenopathy. Lungs: Clear to auscultation without wheezing, rhonchi or rales. {no increased work of breathing. CV: Normal S1, S2 without murmurs. Abdomen: Nondistended, nontender. Skin: Warm and dry, without lesions or rashes. Extremities:  No clubbing, cyanosis or edema. Neuro:   Grossly  intact.  Diagnositics/Labs: None today   Assessment and plan:   Cough (allergic) with post-nasal drainage   - improved   - continue avoidance measures for weed pollens, molds and dust mites   - common food allergens skin testing was negative at initial visit   -continue Dymista.  Dymista 1 spray each nostril twice a day.  This is a combination nasal spray with Flonase + Astelin (nasal antihistamine).  This helps with both nasal congestion and drainage.  If insurance does not cover this will prescribe Astelin separately to use.  Sample provided today.      - continue long-acting antihistamine -Xyzal 5mg  daily   - continue Singulair 10mg  daily   - have access to albuterol inhaler 2 puffs every 4-6 hours as needed for cough/wheeze/shortness of breath/chest tightness.  May use 15-20 minutes prior to activity.   Monitor frequency of use.    Reflux   - continue Dexilant for reflux control and recommendations by Dr. Lyndel Safe     Follow-up 4-6 months or sooner if needed  I appreciate the opportunity to take part in Kahliyah's care. Please do not hesitate to contact me with questions.  Sincerely,   Prudy Feeler, MD Allergy/Immunology [facility]

## 2018-05-05 ENCOUNTER — Ambulatory Visit (INDEPENDENT_AMBULATORY_CARE_PROVIDER_SITE_OTHER): Payer: BLUE CROSS/BLUE SHIELD | Admitting: Gastroenterology

## 2018-05-05 ENCOUNTER — Encounter: Payer: Self-pay | Admitting: Gastroenterology

## 2018-05-05 VITALS — BP 112/74 | HR 65 | Ht 67.0 in | Wt 162.2 lb

## 2018-05-05 DIAGNOSIS — K219 Gastro-esophageal reflux disease without esophagitis: Secondary | ICD-10-CM | POA: Diagnosis not present

## 2018-05-05 DIAGNOSIS — R05 Cough: Secondary | ICD-10-CM

## 2018-05-05 DIAGNOSIS — R059 Cough, unspecified: Secondary | ICD-10-CM

## 2018-05-05 MED ORDER — AMBULATORY NON FORMULARY MEDICATION: 0 refills | Status: DC
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
## 2018-05-05 NOTE — Patient Instructions (Signed)
If you are age 48 or older, your body mass index should be between 23-30. Your Body mass index is 25.41 kg/m. If this is out of the aforementioned range listed, please consider follow up with your Primary Care Provider.  If you are age 15 or younger, your body mass index should be between 19-25. Your Body mass index is 25.41 kg/m. If this is out of the aformentioned range listed, please consider follow up with your Primary Care Provider.   We have sent the following medications to your pharmacy for you to pick up at your convenience:  GI Cocktail   Please call Dr. Leland Her nurse Faythe Casa, RN)  in 2 weeks at [phone]  to let her now how you are doing.    Thank you,  Dr. Jackquline Denmark

## 2018-05-16 DIAGNOSIS — R221 Localized swelling, mass and lump, neck: Secondary | ICD-10-CM | POA: Diagnosis not present

## 2018-05-19 ENCOUNTER — Telehealth: Payer: Self-pay | Admitting: Gastroenterology

## 2018-05-19 NOTE — Telephone Encounter (Signed)
Lets see how it does it for few more days. If still with problems-trial of Levsin 0.125 mg sublingual before eating If still with problems, Carafate elixir 1 g p.o. twice daily If still with problems, ENT consultation

## 2018-05-19 NOTE — Telephone Encounter (Signed)
Please advise 

## 2018-05-19 NOTE — Telephone Encounter (Signed)
Pt reported that the GI cocktail prescribed 1.10.20 numbs "while it lasts for 15-20 minutes" long enough for her to eat, but she would cough as soon as water touches her mouth.

## 2018-05-23 MED ORDER — HYOSCYAMINE SULFATE 0.125 MG SL SUBL: 0.1250 mg | SUBLINGUAL_TABLET | Freq: Three times a day (TID) | SUBLINGUAL | 0 refills | Status: DC

## 2018-05-23 NOTE — Telephone Encounter (Signed)
I have called and left message for patient to return my call.  

## 2018-05-23 NOTE — Telephone Encounter (Signed)
I spoke with patient, she said that she was still having the same problems. I sent in the prescription for Levsin to her pharmacy. I instructed patient to call the office if she felt that this did not help.

## 2018-06-09 ENCOUNTER — Telehealth: Payer: Self-pay | Admitting: Gastroenterology

## 2018-06-09 NOTE — Telephone Encounter (Signed)
PT was told to call back in 2 weeks to let us know how her med hyoscyamine is working for her. Would like to talk to the CDM

## 2018-06-12 NOTE — Telephone Encounter (Signed)
Called and spoke with patient-patient reports the medication is working, however, she is still having the esophageal spasms-randomly, not when she eats, unless she eats too fast-(always happens with tomato based foods/liquids); patient is requesting to know if there is a medication to help with the esophageal spasms go away completely or if she can take the hyoscyamine with another medication to help with the esophageal spasms;    Patient is requesting to know "what is causing the esophageal spasms to happen in the first place"  If patient needs/can stay on this medication please order a 90 day supply for insurance purposes when you send in the refill;   Please advise

## 2018-06-12 NOTE — Telephone Encounter (Signed)
Pt called for the FU on hyocyamine.  Please CB.

## 2018-06-14 ENCOUNTER — Telehealth: Payer: Self-pay | Admitting: Gastroenterology

## 2018-06-14 NOTE — Telephone Encounter (Signed)
Lets continue levsin 0.125mg  S/L 1-2 Q6hrs prn (#240) - she wants 3 month supply Eat slowly Spasms can happen when one eats fast, under stress, or at times without any definite reason. However, if she continues to have problems in the future.  We will consider 24-hour pH and esophageal manometry. She should follow-up in 3 to 6 months.

## 2018-06-14 NOTE — Telephone Encounter (Signed)
Pt called back in regards to this call conersation

## 2018-06-14 NOTE — Telephone Encounter (Signed)
ERROR

## 2018-06-15 NOTE — Telephone Encounter (Signed)
Unable to reach patient-unable to leave a VM-will attempt to reach patient at a later date/time;

## 2018-06-16 ENCOUNTER — Other Ambulatory Visit: Payer: Self-pay

## 2018-06-16 DIAGNOSIS — K224 Dyskinesia of esophagus: Secondary | ICD-10-CM

## 2018-06-16 DIAGNOSIS — K219 Gastro-esophageal reflux disease without esophagitis: Secondary | ICD-10-CM

## 2018-06-16 MED ORDER — HYOSCYAMINE SULFATE SL 0.125 MG SL SUBL: 2.0000 | SUBLINGUAL_TABLET | Freq: Four times a day (QID) | SUBLINGUAL | 3 refills | Status: DC | PRN

## 2018-06-16 NOTE — Telephone Encounter (Signed)
Called and spoke with patient-patient informed of result note and MD recommendations; patient is agreeable with plan of care and verified PCP; result note routed to PCP per MD recommendations; RX sent to pharmacy of patient preference; Patient verbalized understanding of information/instructions;  Patient was advised to call the office at [phone] if questions/concerns arise;

## 2018-08-23 ENCOUNTER — Ambulatory Visit (INDEPENDENT_AMBULATORY_CARE_PROVIDER_SITE_OTHER): Payer: BLUE CROSS/BLUE SHIELD | Admitting: Allergy

## 2018-08-23 ENCOUNTER — Encounter: Payer: Self-pay | Admitting: Allergy

## 2018-08-23 ENCOUNTER — Other Ambulatory Visit: Payer: Self-pay

## 2018-08-23 DIAGNOSIS — J3089 Other allergic rhinitis: Secondary | ICD-10-CM | POA: Diagnosis not present

## 2018-08-23 DIAGNOSIS — R058 Other specified cough: Secondary | ICD-10-CM

## 2018-08-23 DIAGNOSIS — R05 Cough: Secondary | ICD-10-CM | POA: Diagnosis not present

## 2018-08-23 DIAGNOSIS — K219 Gastro-esophageal reflux disease without esophagitis: Secondary | ICD-10-CM

## 2018-08-23 MED ORDER — MONTELUKAST SODIUM 10 MG PO TABS: 10.0000 mg | ORAL_TABLET | Freq: Every day | ORAL | 5 refills | Status: DC

## 2018-08-23 MED ORDER — AZELASTINE-FLUTICASONE 137-50 MCG/ACT NA SUSP: 2.0000 | Freq: Every day | NASAL | 5 refills | Status: DC

## 2018-08-23 NOTE — Progress Notes (Signed)
RE: Sherry Allen MRN: [id number] DOB: 1971-02-16 Date of Telemedicine Visit: 08/23/2018  Referring provider: Mosie Lukes, MD Primary care provider: Mosie Lukes, MD  Chief Complaint: Follow-up   Telemedicine Follow Up Visit via Telephone: I connected with Smitty Pluck for a follow up on 08/23/18 by telephone and verified that I am speaking with the correct person using two identifiers.   I discussed the limitations, risks, security and privacy concerns of performing an evaluation and management service by telephone and the availability of in person appointments. I also discussed with the patient that there may be a patient responsible charge related to this service. The patient expressed understanding and agreed to proceed.  Patient is at home.  Provider is at the office.  Visit start time: 7782 Visit end time: [place] consent/check in by: Dorris Fetch Medical consent and medical assistant/nurse: Wilnette Kales  History of Present Illness: She is a 48 y.o. female, who is being followed for cough with post-nasal drainage and reflux. Her previous allergy office visit was on 04/21/18 with Dr. Nelva Bush.   She states since last visit she has not had any major health changes, surgeries or hospitalizations.  She states that her regimen of Dymista, Xyzal and Singulair are working well for her.  The dymista works the best for control of nasal drainage than the nasal sprays separately.  She also states Xyzal works the best out of the long-acting antihistamines.  She has not needed to use albuterol since being on the above regimen.   She continues on dexilant and states this works the best out of the PPIs.   She states the only time she has cough is related to over-eating leading to reflux symptoms and cough.    Assessment and Plan: Normalee is a 48 y.o. female with:   Cough with post-nasal drainage   - continue avoidance measures for weed pollens, molds and dust mites   - common food  allergens skin testing was negative at initial visit   -continue Dymista 1 spray each nostril twice a day.     - continue long-acting antihistamine, Xyzal 5mg  daily   - continue Singulair 10mg  daily   - have access to albuterol inhaler 2 puffs every 4-6 hours as needed for cough/wheeze/shortness of breath/chest tightness.  May use 15-20 minutes prior to activity.   Monitor frequency of use.    Reflux   - continue Dexilant for reflux control and recommendations by Dr. Lyndel Safe     Follow-up 6-12 months or sooner if needed  Diagnostics: None.  Medication List:  Current Outpatient Medications  Medication Sig Dispense Refill  . albuterol (PROVENTIL HFA;VENTOLIN HFA) 108 (90 Base) MCG/ACT inhaler Inhale 1-2 puffs into the lungs every 6 (six) hours as needed for wheezing or shortness of breath. 1 Inhaler 1  . AMBULATORY NON FORMULARY MEDICATION Medication Name: GI Cocktail  Take 57mls three times daily 15-20 minutes before eating.  Equal parts of Maalox, Bentyl and Viscous Lidocaine 320 mL 0  . Azelastine-Fluticasone (DYMISTA) 137-50 MCG/ACT SUSP Place 2 sprays into both nostrils daily. 1 Bottle 5  . dexlansoprazole (DEXILANT) 60 MG capsule Take 1 capsule (60 mg total) by mouth daily. 30 capsule 11  . hyoscyamine (LEVSIN SL) 0.125 MG SL tablet Place 1 tablet (0.125 mg total) under the tongue 3 (three) times daily before meals. 30 tablet 0  . Hyoscyamine Sulfate SL (LEVSIN/SL) 0.125 MG SUBL Place 2 tablets under the tongue every 6 (six) hours as needed. 1-2 tabs  every 6 hrs as needed for esophageal spasms 240 each 3  . levocetirizine (XYZAL) 5 MG tablet Take 5 mg by mouth every evening.    . montelukast (SINGULAIR) 10 MG tablet Take 1 tablet (10 mg total) by mouth at bedtime. 30 tablet 5  . progesterone (PROMETRIUM) 100 MG capsule Take 100 mg by mouth daily.     No current facility-administered medications for this visit.    Allergies: Allergies  Allergen Reactions  . Sulfa Antibiotics Hives   . Adhesive [Tape] Rash   I reviewed her past medical history, social history, family history, and environmental history and no significant changes have been reported from previous visit on 04/21/18.  Review of Systems  Constitutional: Negative for chills and fever.  HENT: Negative for congestion, postnasal drip, rhinorrhea and sneezing.   Eyes: Negative for pain, discharge and itching.  Respiratory: Negative for cough, chest tightness, shortness of breath and wheezing.   Cardiovascular: Negative.   Gastrointestinal: Negative.   Musculoskeletal: Negative for myalgias.  Skin: Negative for rash.  Neurological: Negative for headaches.   Objective: Physical Exam Not obtained as encounter was done via telephone.   Previous notes and tests were reviewed.  I discussed the assessment and treatment plan with the patient. The patient was provided an opportunity to ask questions and all were answered. The patient agreed with the plan and demonstrated an understanding of the instructions.   The patient was advised to call back or seek an in-person evaluation if the symptoms worsen or if the condition fails to improve as anticipated.  I provided 12 minutes of non-face-to-face time during this encounter.  It was my pleasure to participate in [place] care today. Please feel free to contact me with any questions or concerns.   Sincerely,  Shivangi Lutz Charmian Muff, MD

## 2018-08-23 NOTE — Patient Instructions (Addendum)
Cough with post-nasal drainage   - continue avoidance measures for weed pollens, molds and dust mites   - common food allergens skin testing was negative at initial visit   -continue Dymista 1 spray each nostril twice a day.     - continue long-acting antihistamine, Xyzal 5mg  daily   - continue Singulair 10mg  daily   - have access to albuterol inhaler 2 puffs every 4-6 hours as needed for cough/wheeze/shortness of breath/chest tightness.  May use 15-20 minutes prior to activity.   Monitor frequency of use.    Reflux   - continue Dexilant for reflux control and recommendations by Dr. Lyndel Safe     Follow-up 6-12 months or sooner if needed

## 2018-10-09 ENCOUNTER — Encounter: Payer: 59 | Admitting: Family Medicine

## 2018-10-09 DIAGNOSIS — D225 Melanocytic nevi of trunk: Secondary | ICD-10-CM | POA: Diagnosis not present

## 2018-10-09 DIAGNOSIS — Z01419 Encounter for gynecological examination (general) (routine) without abnormal findings: Secondary | ICD-10-CM | POA: Diagnosis not present

## 2018-10-09 DIAGNOSIS — D1801 Hemangioma of skin and subcutaneous tissue: Secondary | ICD-10-CM | POA: Diagnosis not present

## 2018-10-09 DIAGNOSIS — L814 Other melanin hyperpigmentation: Secondary | ICD-10-CM | POA: Diagnosis not present

## 2018-10-09 DIAGNOSIS — L821 Other seborrheic keratosis: Secondary | ICD-10-CM | POA: Diagnosis not present

## 2018-10-09 DIAGNOSIS — Z6825 Body mass index (BMI) 25.0-25.9, adult: Secondary | ICD-10-CM | POA: Diagnosis not present

## 2018-10-16 ENCOUNTER — Ambulatory Visit (INDEPENDENT_AMBULATORY_CARE_PROVIDER_SITE_OTHER): Payer: BC Managed Care – PPO | Admitting: Family Medicine

## 2018-10-16 ENCOUNTER — Other Ambulatory Visit: Payer: Self-pay

## 2018-10-16 DIAGNOSIS — K219 Gastro-esophageal reflux disease without esophagitis: Secondary | ICD-10-CM | POA: Diagnosis not present

## 2018-10-16 DIAGNOSIS — Z0000 Encounter for general adult medical examination without abnormal findings: Secondary | ICD-10-CM | POA: Diagnosis not present

## 2018-10-16 DIAGNOSIS — G8929 Other chronic pain: Secondary | ICD-10-CM

## 2018-10-16 DIAGNOSIS — M5442 Lumbago with sciatica, left side: Secondary | ICD-10-CM

## 2018-10-16 DIAGNOSIS — M5441 Lumbago with sciatica, right side: Secondary | ICD-10-CM

## 2018-10-16 NOTE — Assessment & Plan Note (Addendum)
Patient encouraged to maintain heart healthy diet, regular exercise, adequate sleep. Consider daily probiotics. Take medications as prescribed. Follows with GYN, Dr Bonner Puna and just had a pap. MGM utd. Last year's labs reviewed will repeat labs next year.
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
## 2018-10-16 NOTE — Assessment & Plan Note (Addendum)
Avoid offending foods, start probiotics. Do not eat large meals in late evening and consider raising head of bed. Frequent esophageal spasm after meals she will try Gaviscon prn

## 2018-10-16 NOTE — Patient Instructions (Addendum)
Gaviscon Tabs 2-4 up to 4 x a day with meals and at bedtime  Liquid 1-2 tablespoons up to 4 x a day with meals and at bedtime. Vitamin C 500 mg tab up to 2 x daily Preventive Care 40-64 Years, Female Preventive care refers to lifestyle choices and visits with your health care provider that can promote health and wellness. What does preventive care include?   A yearly physical exam. This is also called an annual well check.  Dental exams once or twice a year.  Routine eye exams. Ask your health care provider how often you should have your eyes checked.  Personal lifestyle choices, including: ? Daily care of your teeth and gums. ? Regular physical activity. ? Eating a healthy diet. ? Avoiding tobacco and drug use. ? Limiting alcohol use. ? Practicing safe sex. ? Taking low-dose aspirin daily starting at age 47. ? Taking vitamin and mineral supplements as recommended by your health care provider. What happens during an annual well check? The services and screenings done by your health care provider during your annual well check will depend on your age, overall health, lifestyle risk factors, and family history of disease. Counseling Your health care provider may ask you questions about your:  Alcohol use.  Tobacco use.  Drug use.  Emotional well-being.  Home and relationship well-being.  Sexual activity.  Eating habits.  Work and work Statistician.  Method of birth control.  Menstrual cycle.  Pregnancy history. Screening You may have the following tests or measurements:  Height, weight, and BMI.  Blood pressure.  Lipid and cholesterol levels. These may be checked every 5 years, or more frequently if you are over 22 years old.  Skin check.  Lung cancer screening. You may have this screening every year starting at age 14 if you have a 30-pack-year history of smoking and currently smoke or have quit within the past 15 years.  Colorectal cancer screening. All  adults should have this screening starting at age 42 and continuing until age 35. Your health care provider may recommend screening at age 36. You will have tests every 1-10 years, depending on your results and the type of screening test. People at increased risk should start screening at an earlier age. Screening tests may include: ? Guaiac-based fecal occult blood testing. ? Fecal immunochemical test (FIT). ? Stool DNA test. ? Virtual colonoscopy. ? Sigmoidoscopy. During this test, a flexible tube with a tiny camera (sigmoidoscope) is used to examine your rectum and lower colon. The sigmoidoscope is inserted through your anus into your rectum and lower colon. ? Colonoscopy. During this test, a long, thin, flexible tube with a tiny camera (colonoscope) is used to examine your entire colon and rectum.  Hepatitis C blood test.  Hepatitis B blood test.  Sexually transmitted disease (STD) testing.  Diabetes screening. This is done by checking your blood sugar (glucose) after you have not eaten for a while (fasting). You may have this done every 1-3 years.  Mammogram. This may be done every 1-2 years. Talk to your health care provider about when you should start having regular mammograms. This may depend on whether you have a family history of breast cancer.  BRCA-related cancer screening. This may be done if you have a family history of breast, ovarian, tubal, or peritoneal cancers.  Pelvic exam and Pap test. This may be done every 3 years starting at age 40. Starting at age 33, this may be done every 5 years if you have  a Pap test in combination with an HPV test.  Bone density scan. This is done to screen for osteoporosis. You may have this scan if you are at high risk for osteoporosis. Discuss your test results, treatment options, and if necessary, the need for more tests with your health care provider. Vaccines Your health care provider may recommend certain vaccines, such as:  Influenza  vaccine. This is recommended every year.  Tetanus, diphtheria, and acellular pertussis (Tdap, Td) vaccine. You may need a Td booster every 10 years.  Varicella vaccine. You may need this if you have not been vaccinated.  Zoster vaccine. You may need this after age 66.  Measles, mumps, and rubella (MMR) vaccine. You may need at least one dose of MMR if you were born in 1957 or later. You may also need a second dose.  Pneumococcal 13-valent conjugate (PCV13) vaccine. You may need this if you have certain conditions and were not previously vaccinated.  Pneumococcal polysaccharide (PPSV23) vaccine. You may need one or two doses if you smoke cigarettes or if you have certain conditions.  Meningococcal vaccine. You may need this if you have certain conditions.  Hepatitis A vaccine. You may need this if you have certain conditions or if you travel or work in places where you may be exposed to hepatitis A.  Hepatitis B vaccine. You may need this if you have certain conditions or if you travel or work in places where you may be exposed to hepatitis B.  Haemophilus influenzae type b (Hib) vaccine. You may need this if you have certain conditions. Talk to your health care provider about which screenings and vaccines you need and how often you need them. This information is not intended to replace advice given to you by your health care provider. Make sure you discuss any questions you have with your health care provider. Document Released: 05/09/2015 Document Revised: 06/02/2017 Document Reviewed: 02/11/2015 Elsevier Interactive Patient Education  2019 [employer].

## 2018-10-16 NOTE — Assessment & Plan Note (Signed)
Pain continues to escalate with time. Pain can radiate to knees and times. Is not interested in a referral to ortho at this time. Is considering using on a cane on days with significant moisture or rain, Consider Tylenol 500 mg bid on bad days and max of 2 tabs tid, Advil 200 mg tabs 2 tabs po qid prn with food. Stay well hydrated and take with food, stay as active as able

## 2018-10-19 ENCOUNTER — Encounter: Payer: Self-pay | Admitting: Family Medicine

## 2018-12-01 ENCOUNTER — Other Ambulatory Visit: Payer: Self-pay | Admitting: Obstetrics and Gynecology

## 2018-12-01 DIAGNOSIS — Z1231 Encounter for screening mammogram for malignant neoplasm of breast: Secondary | ICD-10-CM

## 2019-01-17 ENCOUNTER — Ambulatory Visit
Admission: RE | Admit: 2019-01-17 | Discharge: 2019-01-17 | Disposition: A | Discharge status: Home / Self Care | Payer: BC Managed Care – PPO | Source: Ambulatory Visit | Attending: Obstetrics and Gynecology | Admitting: Obstetrics and Gynecology

## 2019-01-17 ENCOUNTER — Other Ambulatory Visit: Payer: Self-pay

## 2019-01-17 DIAGNOSIS — Z1231 Encounter for screening mammogram for malignant neoplasm of breast: Secondary | ICD-10-CM | POA: Diagnosis not present

## 2019-02-06 ENCOUNTER — Other Ambulatory Visit: Payer: Self-pay

## 2019-02-06 ENCOUNTER — Ambulatory Visit (INDEPENDENT_AMBULATORY_CARE_PROVIDER_SITE_OTHER): Payer: BC Managed Care – PPO | Admitting: Family Medicine

## 2019-02-06 DIAGNOSIS — M5441 Lumbago with sciatica, right side: Secondary | ICD-10-CM

## 2019-02-06 DIAGNOSIS — M2550 Pain in unspecified joint: Secondary | ICD-10-CM

## 2019-02-06 DIAGNOSIS — M25562 Pain in left knee: Secondary | ICD-10-CM

## 2019-02-06 DIAGNOSIS — Z0000 Encounter for general adult medical examination without abnormal findings: Secondary | ICD-10-CM | POA: Diagnosis not present

## 2019-02-06 DIAGNOSIS — M25561 Pain in right knee: Secondary | ICD-10-CM

## 2019-02-06 DIAGNOSIS — M25552 Pain in left hip: Secondary | ICD-10-CM

## 2019-02-06 DIAGNOSIS — K219 Gastro-esophageal reflux disease without esophagitis: Secondary | ICD-10-CM

## 2019-02-06 DIAGNOSIS — G8929 Other chronic pain: Secondary | ICD-10-CM

## 2019-02-06 DIAGNOSIS — M5442 Lumbago with sciatica, left side: Secondary | ICD-10-CM

## 2019-02-06 DIAGNOSIS — M25551 Pain in right hip: Secondary | ICD-10-CM | POA: Diagnosis not present

## 2019-02-07 ENCOUNTER — Ambulatory Visit (INDEPENDENT_AMBULATORY_CARE_PROVIDER_SITE_OTHER): Payer: BC Managed Care – PPO | Admitting: Gastroenterology

## 2019-02-07 ENCOUNTER — Other Ambulatory Visit: Payer: Self-pay

## 2019-02-07 ENCOUNTER — Encounter: Payer: Self-pay | Admitting: Gastroenterology

## 2019-02-07 VITALS — BP 108/72 | HR 61 | Temp 98.4°F | Ht 67.0 in | Wt 163.2 lb

## 2019-02-07 DIAGNOSIS — R059 Cough, unspecified: Secondary | ICD-10-CM

## 2019-02-07 DIAGNOSIS — R05 Cough: Secondary | ICD-10-CM | POA: Diagnosis not present

## 2019-02-07 DIAGNOSIS — M2550 Pain in unspecified joint: Secondary | ICD-10-CM | POA: Insufficient documentation

## 2019-02-07 MED ORDER — DEXILANT 60 MG PO CPDR: 60.0000 mg | DELAYED_RELEASE_CAPSULE | Freq: Every day | ORAL | 11 refills | Status: DC

## 2019-02-07 MED ORDER — AMITRIPTYLINE HCL 25 MG PO TABS: 25.0000 mg | ORAL_TABLET | Freq: Every day | ORAL | 6 refills | Status: DC
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
## 2019-02-07 NOTE — Patient Instructions (Signed)
If you are age 48 or older, your body mass index should be between 23-30. Your Body mass index is 25.57 kg/m. If this is out of the aforementioned range listed, please consider follow up with your Primary Care Provider.  If you are age 10 or younger, your body mass index should be between 19-25. Your Body mass index is 25.57 kg/m. If this is out of the aformentioned range listed, please consider follow up with your Primary Care Provider.   We have sent the following medications to your pharmacy for you to pick up at your convenience: Dexilant Amitriptyline  Please call Dr. Leland Her nurse Faythe Casa, RN)  in 2 weeks at [phone]  to let her now how you are doing.  Follow up in 12 weeks.   Thank you,  Dr. Jackquline Denmark

## 2019-02-07 NOTE — Assessment & Plan Note (Signed)
Avoid offending foods, start probiotics. Do not eat large meals in late evening and consider raising head of bed. Minimize NSAIDs

## 2019-02-15 ENCOUNTER — Other Ambulatory Visit (INDEPENDENT_AMBULATORY_CARE_PROVIDER_SITE_OTHER): Payer: BC Managed Care – PPO

## 2019-02-15 ENCOUNTER — Other Ambulatory Visit: Payer: Self-pay

## 2019-02-15 DIAGNOSIS — M25562 Pain in left knee: Secondary | ICD-10-CM | POA: Diagnosis not present

## 2019-02-15 DIAGNOSIS — M25552 Pain in left hip: Secondary | ICD-10-CM | POA: Diagnosis not present

## 2019-02-15 DIAGNOSIS — M25561 Pain in right knee: Secondary | ICD-10-CM

## 2019-02-15 DIAGNOSIS — M25551 Pain in right hip: Secondary | ICD-10-CM

## 2019-02-15 DIAGNOSIS — Z0000 Encounter for general adult medical examination without abnormal findings: Secondary | ICD-10-CM

## 2019-02-15 LAB — CBC
HCT: 41.7 % (ref 36.0–46.0)
Hemoglobin: 14.1 g/dL (ref 12.0–15.0)
MCHC: 33.9 g/dL (ref 30.0–36.0)
MCV: 87.7 fl (ref 78.0–100.0)
Platelets: 275 10*3/uL (ref 150.0–400.0)
RBC: 4.76 Mil/uL (ref 3.87–5.11)
RDW: 12.6 % (ref 11.5–15.5)
WBC: 5.4 10*3/uL (ref 4.0–10.5)

## 2019-02-15 LAB — LIPID PANEL
Cholesterol: 170 mg/dL (ref 0–200)
HDL: 64.3 mg/dL (ref >39.00)
LDL Cholesterol: 90 mg/dL (ref 0–99)
NonHDL: 105.28
Total CHOL/HDL Ratio: 3
Triglycerides: 77 mg/dL (ref 0.0–149.0)
VLDL: 15.4 mg/dL (ref 0.0–40.0)

## 2019-02-15 LAB — COMPREHENSIVE METABOLIC PANEL
ALT: 18 U/L (ref 0–35)
AST: 15 U/L (ref 0–37)
Albumin: 4.7 g/dL (ref 3.5–5.2)
Alkaline Phosphatase: 51 U/L (ref 39–117)
BUN: 10 mg/dL (ref 6–23)
CO2: 28 mEq/L (ref 19–32)
Calcium: 9.7 mg/dL (ref 8.4–10.5)
Chloride: 100 mEq/L (ref 96–112)
Creatinine, Ser: 0.66 mg/dL (ref 0.40–1.20)
GFR: 95.58 mL/min (ref >60.00)
Glucose, Bld: 93 mg/dL (ref 70–99)
Potassium: 3.9 mEq/L (ref 3.5–5.1)
Sodium: 136 mEq/L (ref 135–145)
Total Bilirubin: 0.5 mg/dL (ref 0.2–1.2)
Total Protein: 7.4 g/dL (ref 6.0–8.3)

## 2019-02-15 LAB — TSH: TSH: 1.32 u[IU]/mL (ref 0.35–4.50)

## 2019-02-15 LAB — SEDIMENTATION RATE: Sed Rate: 6 mm/hr (ref 0–20)

## 2019-02-18 LAB — ANTI-NUCLEAR AB-TITER (ANA TITER): ANA Titer 1: 1:40 {titer} — ABNORMAL HIGH

## 2019-02-18 LAB — ANA: Anti Nuclear Antibody (ANA): POSITIVE — AB

## 2019-02-18 LAB — RHEUMATOID FACTOR: Rheumatoid fact SerPl-aCnc: <14 IU/mL (ref <14)

## 2019-02-21 ENCOUNTER — Telehealth: Payer: Self-pay | Admitting: Gastroenterology

## 2019-02-21 NOTE — Telephone Encounter (Signed)
Please advise 

## 2019-02-21 NOTE — Telephone Encounter (Signed)
Pt reported that she has been on amitriptyline for two weeks and have not seen much of a difference.

## 2019-02-22 NOTE — Telephone Encounter (Signed)
Lets increase amitriptyline to 50 mg p.o. nightly for 2 weeks Let us know how she is in 2 weeks Thx  RG

## 2019-02-23 NOTE — Telephone Encounter (Signed)
I have called and informed patient, patient voiced understanding.

## 2019-03-12 ENCOUNTER — Telehealth: Payer: Self-pay | Admitting: Gastroenterology

## 2019-03-12 DIAGNOSIS — K224 Dyskinesia of esophagus: Secondary | ICD-10-CM

## 2019-03-12 DIAGNOSIS — R059 Cough, unspecified: Secondary | ICD-10-CM

## 2019-03-12 DIAGNOSIS — R05 Cough: Secondary | ICD-10-CM

## 2019-03-12 DIAGNOSIS — K219 Gastro-esophageal reflux disease without esophagitis: Secondary | ICD-10-CM

## 2019-03-12 NOTE — Telephone Encounter (Signed)
Please review previous message and advise of next step in plan of care

## 2019-03-12 NOTE — Telephone Encounter (Signed)
Dr. Lyndel Safe upped dosage to 50 mg for Amitriptyline and wanted patient to call back with update- Patient states that the spasms are decreasing but she is still having them. She kept a talley of how many times they happened a day and states they have probably decreased by about 50% but they are still happening.

## 2019-03-13 MED ORDER — AMITRIPTYLINE HCL 50 MG PO TABS: 50.0000 mg | ORAL_TABLET | Freq: Every day | ORAL | 6 refills | Status: DC

## 2019-03-13 NOTE — Telephone Encounter (Signed)
Lets continue on amitriptyline 50 mg p.o. nightly for now.  If she needs a prescription please call it in #30, 6 refills. Lets get ENT consultation as per last clinic note.  Please send her to Dr. Meredith Leeds. FU as per last clinic note.  Thx  RG

## 2019-03-13 NOTE — Telephone Encounter (Signed)
Called and spoke with patient-patient informed of MD recommendations; patient is agreeable with plan of care and wants to report the medication is working tremendously better than the 25 mg dosage; RX sent to pharmacy of patient choice; Patient verbalized understanding of information/instructions;  Patient was advised to call the office at [phone] if questions/concerns arise;   Patient is requesting to hold off on the referral at this time-even though referral has already been placed;

## 2019-03-27 ENCOUNTER — Other Ambulatory Visit: Payer: Self-pay

## 2019-03-27 ENCOUNTER — Encounter: Payer: Self-pay | Admitting: Family Medicine

## 2019-03-27 ENCOUNTER — Ambulatory Visit (INDEPENDENT_AMBULATORY_CARE_PROVIDER_SITE_OTHER): Payer: BC Managed Care – PPO | Admitting: Family Medicine

## 2019-03-27 DIAGNOSIS — G8929 Other chronic pain: Secondary | ICD-10-CM | POA: Diagnosis not present

## 2019-03-27 DIAGNOSIS — M25552 Pain in left hip: Secondary | ICD-10-CM | POA: Diagnosis not present

## 2019-03-27 DIAGNOSIS — S329XXK Fracture of unspecified parts of lumbosacral spine and pelvis, subsequent encounter for fracture with nonunion: Secondary | ICD-10-CM | POA: Diagnosis not present

## 2019-03-27 MED ORDER — VITAMIN D (ERGOCALCIFEROL) 1.25 MG (50000 UNIT) PO CAPS: 50000.0000 [IU] | ORAL_CAPSULE | ORAL | 0 refills | Status: DC
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
## 2019-03-27 NOTE — Patient Instructions (Addendum)
Good to see you.  K2 200 mcg Ice 20 minutes 2 times daily. Usually after activity and before bed. Exercises 3 times a week.  Once weekly vitamin D for 12 weeks.  Turmeric 500mg  daily  Tart cherry extract 1200mg  at night See me again in 6-8 weeks

## 2019-03-27 NOTE — Assessment & Plan Note (Signed)
Severe arthritic changes.  Discussed which activities to do which wants to avoid.  Discussed icing regimen and home exercises, discussed topical anti-inflammatories.  Patient wanted to try conservative therapy initially.  Would consider the possibility of changing the amitriptyline to Effexor.  Patient is to increase follow-up again in 4 to 8 weeks

## 2019-03-27 NOTE — Assessment & Plan Note (Signed)
Once weekly vitamin D, discussed icing regimen and home exercise, discussed which activities to do which wants to avoid.  Patient should increase activity slowly.  Follow-up again in 4 to 8 weeks

## 2019-03-29 NOTE — Telephone Encounter (Signed)
Notification received that this patient has been scheduled to see Dr. Laurance Flatten at ENT on 04/02/2019 at 2:10 pm;   Called and spoke with patient-patient reports she is willing to go to the ENT to be assessed;  Patient reports the Elavil at 50mg  is working very well-coughing about 5 times per day now; patient has been scheduled for a f/u appt with Dr. Lyndel Safe on 04/30/2018 at 4:00 pm per patient request;  Patient advised to call back to the office at [phone] should questions/concerns arise;  Patient verbalized understanding of information/instructions;

## 2019-04-02 DIAGNOSIS — R05 Cough: Secondary | ICD-10-CM | POA: Diagnosis not present

## 2019-05-01 ENCOUNTER — Other Ambulatory Visit: Payer: Self-pay

## 2019-05-01 ENCOUNTER — Telehealth (INDEPENDENT_AMBULATORY_CARE_PROVIDER_SITE_OTHER): Payer: BC Managed Care – PPO | Admitting: Gastroenterology

## 2019-05-01 VITALS — Ht 67.0 in | Wt 160.0 lb

## 2019-05-01 DIAGNOSIS — R05 Cough: Secondary | ICD-10-CM

## 2019-05-01 DIAGNOSIS — R059 Cough, unspecified: Secondary | ICD-10-CM

## 2019-05-01 DIAGNOSIS — K449 Diaphragmatic hernia without obstruction or gangrene: Secondary | ICD-10-CM

## 2019-05-01 DIAGNOSIS — K219 Gastro-esophageal reflux disease without esophagitis: Secondary | ICD-10-CM

## 2019-05-01 MED ORDER — AMITRIPTYLINE HCL 75 MG PO TABS: 75.0000 mg | ORAL_TABLET | Freq: Every day | ORAL | 11 refills | Status: DC

## 2019-05-01 MED ORDER — DEXILANT 60 MG PO CPDR: 60.0000 mg | DELAYED_RELEASE_CAPSULE | Freq: Every day | ORAL | 11 refills | Status: DC
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
## 2019-05-01 NOTE — Patient Instructions (Signed)
If you are age 49 or older, your body mass index should be between 23-30. Your Body mass index is 25.06 kg/m. If this is out of the aforementioned range listed, please consider follow up with your Primary Care Provider.  If you are age 51 or younger, your body mass index should be between 19-25. Your Body mass index is 25.06 kg/m. If this is out of the aformentioned range listed, please consider follow up with your Primary Care Provider.   We have sent the following medications to your pharmacy for you to pick up at your convenience: Dexilant Amitriptyline  Please call Dr. Leland Her nurse Karl Pock, RN)  in 2 weeks at [phone]  to let her now how you are doing.   Follow up in 12 weeks.   Thank you,  Dr. Jackquline Denmark

## 2019-05-03 ENCOUNTER — Ambulatory Visit: Payer: Self-pay | Admitting: *Deleted

## 2019-05-03 ENCOUNTER — Other Ambulatory Visit: Payer: Self-pay

## 2019-05-03 ENCOUNTER — Emergency Department (HOSPITAL_BASED_OUTPATIENT_CLINIC_OR_DEPARTMENT_OTHER)
Admission: EM | Admit: 2019-05-03 | Discharge: 2019-05-03 | Disposition: A | Discharge status: Home / Self Care | Payer: BC Managed Care – PPO | Attending: Emergency Medicine | Admitting: Emergency Medicine

## 2019-05-03 ENCOUNTER — Encounter (HOSPITAL_BASED_OUTPATIENT_CLINIC_OR_DEPARTMENT_OTHER): Payer: Self-pay | Admitting: Emergency Medicine

## 2019-05-03 ENCOUNTER — Emergency Department (HOSPITAL_BASED_OUTPATIENT_CLINIC_OR_DEPARTMENT_OTHER): Payer: BC Managed Care – PPO

## 2019-05-03 ENCOUNTER — Telehealth: Payer: Self-pay | Admitting: Gastroenterology

## 2019-05-03 DIAGNOSIS — K625 Hemorrhage of anus and rectum: Secondary | ICD-10-CM | POA: Diagnosis not present

## 2019-05-03 DIAGNOSIS — Z79899 Other long term (current) drug therapy: Secondary | ICD-10-CM | POA: Diagnosis not present

## 2019-05-03 DIAGNOSIS — Z91048 Other nonmedicinal substance allergy status: Secondary | ICD-10-CM | POA: Diagnosis not present

## 2019-05-03 DIAGNOSIS — Z882 Allergy status to sulfonamides status: Secondary | ICD-10-CM | POA: Diagnosis not present

## 2019-05-03 DIAGNOSIS — R42 Dizziness and giddiness: Secondary | ICD-10-CM | POA: Diagnosis not present

## 2019-05-03 DIAGNOSIS — K529 Noninfective gastroenteritis and colitis, unspecified: Secondary | ICD-10-CM | POA: Diagnosis not present

## 2019-05-03 DIAGNOSIS — R109 Unspecified abdominal pain: Secondary | ICD-10-CM | POA: Diagnosis not present

## 2019-05-03 LAB — CBC
HCT: 46 % (ref 36.0–46.0)
Hemoglobin: 15.1 g/dL — ABNORMAL HIGH (ref 12.0–15.0)
MCH: 29.2 pg (ref 26.0–34.0)
MCHC: 32.8 g/dL (ref 30.0–36.0)
MCV: 89 fL (ref 80.0–100.0)
Platelets: 319 10*3/uL (ref 150–400)
RBC: 5.17 MIL/uL — ABNORMAL HIGH (ref 3.87–5.11)
RDW: 12.4 % (ref 11.5–15.5)
WBC: 10.7 10*3/uL — ABNORMAL HIGH (ref 4.0–10.5)
nRBC: 0 % (ref 0.0–0.2)

## 2019-05-03 LAB — URINALYSIS, ROUTINE W REFLEX MICROSCOPIC
Bilirubin Urine: NEGATIVE
Glucose, UA: NEGATIVE mg/dL
Hgb urine dipstick: NEGATIVE
Ketones, ur: 15 mg/dL — AB
Leukocytes,Ua: NEGATIVE
Nitrite: NEGATIVE
Protein, ur: NEGATIVE mg/dL
Specific Gravity, Urine: <1.005 — ABNORMAL LOW (ref 1.005–1.030)
pH: 6.5 (ref 5.0–8.0)

## 2019-05-03 LAB — LIPASE, BLOOD: Lipase: 23 U/L (ref 11–51)

## 2019-05-03 LAB — PREGNANCY, URINE: Preg Test, Ur: NEGATIVE

## 2019-05-03 LAB — COMPREHENSIVE METABOLIC PANEL
ALT: 35 U/L (ref 0–44)
AST: 25 U/L (ref 15–41)
Albumin: 4.9 g/dL (ref 3.5–5.0)
Alkaline Phosphatase: 64 U/L (ref 38–126)
Anion gap: 10 (ref 5–15)
BUN: 11 mg/dL (ref 6–20)
CO2: 27 mmol/L (ref 22–32)
Calcium: 9.6 mg/dL (ref 8.9–10.3)
Chloride: 100 mmol/L (ref 98–111)
Creatinine, Ser: 0.57 mg/dL (ref 0.44–1.00)
GFR calc Af Amer: >60 mL/min (ref >60)
GFR calc non Af Amer: >60 mL/min (ref >60)
Glucose, Bld: 109 mg/dL — ABNORMAL HIGH (ref 70–99)
Potassium: 3.6 mmol/L (ref 3.5–5.1)
Sodium: 137 mmol/L (ref 135–145)
Total Bilirubin: 1.1 mg/dL (ref 0.3–1.2)
Total Protein: 8.5 g/dL — ABNORMAL HIGH (ref 6.5–8.1)

## 2019-05-03 LAB — OCCULT BLOOD X 1 CARD TO LAB, STOOL: Fecal Occult Bld: POSITIVE — AB

## 2019-05-03 MED ORDER — CIPROFLOXACIN HCL 500 MG PO TABS
500.0000 mg | ORAL_TABLET | Freq: Once | ORAL | Status: AC
  Administered 2019-05-03: 500 mg via ORAL
  Filled 2019-05-03: qty 1

## 2019-05-03 MED ORDER — IOHEXOL 300 MG/ML  SOLN
100.0000 mL | Freq: Once | INTRAMUSCULAR | Status: AC | PRN
  Administered 2019-05-03: 100 mL via INTRAVENOUS

## 2019-05-03 MED ORDER — SODIUM CHLORIDE 0.9 % IV BOLUS
1000.0000 mL | Freq: Once | INTRAVENOUS | Status: AC
  Administered 2019-05-03: 1000 mL via INTRAVENOUS

## 2019-05-03 MED ORDER — ACETAMINOPHEN 325 MG PO TABS
650.0000 mg | ORAL_TABLET | Freq: Once | ORAL | Status: AC
  Administered 2019-05-03: 650 mg via ORAL
  Filled 2019-05-03: qty 2

## 2019-05-03 MED ORDER — METRONIDAZOLE 500 MG PO TABS
500.0000 mg | ORAL_TABLET | Freq: Once | ORAL | Status: AC
  Administered 2019-05-03: 500 mg via ORAL
  Filled 2019-05-03: qty 1

## 2019-05-03 MED ORDER — CIPROFLOXACIN HCL 500 MG PO TABS: 500.0000 mg | ORAL_TABLET | Freq: Two times a day (BID) | ORAL | 0 refills | Status: AC

## 2019-05-03 MED ORDER — METRONIDAZOLE 500 MG PO TABS: 500.0000 mg | ORAL_TABLET | Freq: Three times a day (TID) | ORAL | 0 refills | Status: AC

## 2019-05-03 NOTE — ED Notes (Signed)
LIPASE RESULTED

## 2019-05-03 NOTE — ED Triage Notes (Signed)
Bright red rectal bleeding with intermittent low abd pain since last night.

## 2019-05-03 NOTE — Discharge Instructions (Addendum)
You have been diagnosed today with Colitis.  At this time there does not appear to be the presence of an emergent medical condition, however there is always the potential for conditions to change. Please read and follow the below instructions.  Please return to the Emergency Department immediately for any new or worsening symptoms. Please be sure to follow up with your Primary Care Provider within one week regarding your visit today; please call their office to schedule an appointment even if you are feeling better for a follow-up visit. Please call Dr. Randel Pigg office today to schedule a follow-up appointment for next week.  Please take the antibiotics Flagyl and Cipro as prescribed to help with your symptoms.  Please drink plenty water and get plenty of rest.  Your GI panel and C. difficile test is still pending but you may follow-up on these tests on your MyChart account in the next 1-2 days.  Please discuss the results with Dr. Lyndel Safe and your primary care doctor as soon as you receive them in the next visit.  Get help right away if you: Have a fever that does not go away with treatment. Develop chills. Have extreme weakness, fainting, or dehydration. Have repeated vomiting. Develop severe pain in your abdomen. Pass bloody or tarry stool. You have any new/concerning or worsening of symptoms  Please read the additional information packets attached to your discharge summary.  Do not take your medicine if  develop an itchy rash, swelling in your mouth or lips, or difficulty breathing; call 911 and seek immediate emergency medical attention if this occurs.  Note: Portions of this text may have been transcribed using voice recognition software. Every effort was made to ensure accuracy; however, inadvertent computerized transcription errors may still be present.

## 2019-05-03 NOTE — ED Notes (Signed)
IVF bolus initiated per orders, site WNL.

## 2019-05-03 NOTE — Telephone Encounter (Signed)
She was seen in ED today with diarrhea mixed with blood. Had CT scan which showed colitis Started on Cipro and Flagyl x 7 days. We recently saw her for reflux and cough.  Please, work her into my clinic for next week.  Can do televisit  Thx  RG

## 2019-05-03 NOTE — Telephone Encounter (Signed)
Check with her later and see if she went to ED or GI. She needs to be seen somewhere if she is still bleeding

## 2019-05-03 NOTE — ED Notes (Signed)
Pt. Said she has been staying in contact with her mother to let her know what is going on.

## 2019-05-03 NOTE — Telephone Encounter (Signed)
Please review nurse triage note from [facility] from today; Please advise

## 2019-05-03 NOTE — ED Notes (Signed)
Presents today with c/o rectal bleeding, onset and noticed last PM. Bright red blood per pt statement. Was having abd pain, felt distended. Having normal bowel movements. Having some loose, watery stool. Foul odor noted as well. Last PM states she passed out while sitting on commode. States she has GERD and seeing Dr. Lyndel Safe, MD GI at [facility] with [facility].

## 2019-05-03 NOTE — ED Notes (Signed)
Blood draw attempted x 1 without success.  

## 2019-05-03 NOTE — ED Notes (Signed)
IV Left forearm unsuccessful x1, labs obtained.

## 2019-05-03 NOTE — ED Notes (Signed)
Up to BR to obtain urine spec as requested by EDP

## 2019-05-03 NOTE — Telephone Encounter (Signed)
Patient states she went to bathroom last night with pain in stomach- thought she had to have BM. Patient had nausea and while sitting on the toilet- patient fainted. Patient felt clammy as she recovered - but still had cramping in abdomen. This morning patient had bleeding from the rectum without BM. Patient is still nauseous at times and is still cramping. Advised ED for evaluation - patient is also going to reach out to her GI provider as well.  Reason for Disposition . [1] Constant abdominal pain AND [2] present > 2 hours  Answer Assessment - Initial Assessment Questions 1. APPEARANCE of BLOOD: "What color is it?" "Is it passed separately, on the surface of the stool, or mixed in with the stool?"      Bright red, passed seperately 2. AMOUNT: "How much blood was passed?"      3-4 tablespoons 3. FREQUENCY: "How many times has blood been passed with the stools?"      She thought she saw blood when she wipes last night- this morning without BM 4. ONSET: "When was the blood first seen in the stools?" (Days or weeks)      today 5. DIARRHEA: "Is there also some diarrhea?" If so, ask: "How many diarrhea stools were passed in past 24 hours?"      no 6. CONSTIPATION: "Do you have constipation?" If so, "How bad is it?"     no 7. RECURRENT SYMPTOMS: "Have you had blood in your stools before?" If so, ask: "When was the last time?" and "What happened that time?"      no 8. BLOOD THINNERS: "Do you take any blood thinners?" (e.g., Coumadin/warfarin, Pradaxa/dabigatran, aspirin)     No 9. OTHER SYMPTOMS: "Do you have any other symptoms?"  (e.g., abdominal pain, vomiting, dizziness, fever)     Abdominal pain, nausea- no vomiting 10. PREGNANCY: "Is there any chance you are pregnant?" "When was your last menstrual period?"       n/a  Protocols used: RECTAL BLEEDING-A-AH
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
## 2019-05-03 NOTE — ED Notes (Signed)
DC instructions given to and reviewed with pt. Copy of instructions provided. Stressed the importance of completing all abx prescribed by ED provider, also the importance of keeping follow up GI MD appt and with Primary MD as well. Instructed pt on strict handwashing during this time as well. Also discussed times to return to ED if needed. Opportunity for questions provided.

## 2019-05-03 NOTE — ED Notes (Signed)
States did take in small amt of PO liquids today, Last solid was very small bites at approx 1830hrs. PT INSTRUCTED TO BE NPO UNTIL FURTHER NOTICE

## 2019-05-03 NOTE — ED Notes (Signed)
Returns from  CT scan, no c/o nausea noted. IVF bolus continues as ordered Placed on cont cardiac monitor, vs updated

## 2019-05-03 NOTE — ED Notes (Signed)
C/o lower abd pain, ache, rates pain a 2-3 on 0-10 scale, ED-PA consulted, awaiting orders. Pt informed, comfort measures provided

## 2019-05-03 NOTE — Telephone Encounter (Signed)
I have called and spoke with patient, patient has a follow up visit 05/10/19 at 9:50am.

## 2019-05-04 NOTE — Telephone Encounter (Signed)
Reviewed ED notes offer her a follow up virtual visit in next week or two

## 2019-05-04 NOTE — Telephone Encounter (Signed)
Please schedule patient a virtual visit in the next week or two Please advise

## 2019-05-04 NOTE — Telephone Encounter (Signed)
Patient was seen in the ER

## 2019-05-07 NOTE — Telephone Encounter (Signed)
Scheduled telephone visit 05/17/2018

## 2019-05-08 ENCOUNTER — Other Ambulatory Visit: Payer: Self-pay

## 2019-05-08 ENCOUNTER — Ambulatory Visit: Payer: BC Managed Care – PPO

## 2019-05-08 ENCOUNTER — Ambulatory Visit: Payer: BC Managed Care – PPO | Admitting: Family Medicine

## 2019-05-08 ENCOUNTER — Encounter: Payer: Self-pay | Admitting: Family Medicine

## 2019-05-08 VITALS — BP 106/80 | Ht 67.0 in | Wt 163.0 lb

## 2019-05-08 DIAGNOSIS — M1612 Unilateral primary osteoarthritis, left hip: Secondary | ICD-10-CM | POA: Diagnosis not present

## 2019-05-08 DIAGNOSIS — M25552 Pain in left hip: Secondary | ICD-10-CM | POA: Diagnosis not present

## 2019-05-08 DIAGNOSIS — S329XXK Fracture of unspecified parts of lumbosacral spine and pelvis, subsequent encounter for fracture with nonunion: Secondary | ICD-10-CM | POA: Diagnosis not present

## 2019-05-08 NOTE — Assessment & Plan Note (Addendum)
Will likely need hip surgery at a later date.  Patient does not want to do it yet.  We will continue to monitor.  Follow-up with me again in 6 weeks.  Hold on referral to orthopedics at this time spent  25 minutes with patient face-to-face and had greater than 50% of counseling including as described above in assessment and plan.

## 2019-05-08 NOTE — Patient Instructions (Signed)
Continue everything Probiotic with 10 diff strains and at least 10 billion units See me in 6 weeks to repeat xrays and discuss seeing ortho

## 2019-05-08 NOTE — Assessment & Plan Note (Addendum)
Patient is making some progress at this time.  Patient has noticed some mild improvement.  Held on any type of x-rays because I do not think it would change her medical management at this moment but will have patient come back again in 6 weeks.  Continue the vitamin D and the vitamin supplementation.  Discussed icing regimen and topical anti-inflammatories.  Follow-up again in 6 weeks
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
## 2019-05-10 ENCOUNTER — Telehealth (INDEPENDENT_AMBULATORY_CARE_PROVIDER_SITE_OTHER): Payer: BC Managed Care – PPO | Admitting: Gastroenterology

## 2019-05-10 ENCOUNTER — Other Ambulatory Visit: Payer: Self-pay

## 2019-05-10 DIAGNOSIS — K5289 Other specified noninfective gastroenteritis and colitis: Secondary | ICD-10-CM

## 2019-05-10 NOTE — Patient Instructions (Signed)
If you are age 49 or older, your body mass index should be between 23-30. Your There is no height or weight on file to calculate BMI. If this is out of the aforementioned range listed, please consider follow up with your Primary Care Provider.  If you are age 6 or younger, your body mass index should be between 19-25. Your There is no height or weight on file to calculate BMI. If this is out of the aformentioned range listed, please consider follow up with your Primary Care Provider.   It has been recommended to you by your physician that you have a(n) Colonoscopy completed. Per your request, we did not schedule the procedure(s) today. Please contact our office at [phone] should you decide to have the procedure completed. You will be scheduled for a pre-visit and procedure at that time.  Continue Amitriptyline and Dexilant.   Can use probiotic x 2 weeks and thereafter if having bloating.  Thank you,  Dr. Jackquline Denmark
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
## 2019-05-17 ENCOUNTER — Telehealth: Payer: BC Managed Care – PPO | Admitting: Gastroenterology

## 2019-05-18 ENCOUNTER — Ambulatory Visit (INDEPENDENT_AMBULATORY_CARE_PROVIDER_SITE_OTHER): Payer: BC Managed Care – PPO | Admitting: Family Medicine

## 2019-05-18 ENCOUNTER — Other Ambulatory Visit: Payer: Self-pay

## 2019-05-18 DIAGNOSIS — K529 Noninfective gastroenteritis and colitis, unspecified: Secondary | ICD-10-CM | POA: Diagnosis not present

## 2019-05-18 DIAGNOSIS — T7840XD Allergy, unspecified, subsequent encounter: Secondary | ICD-10-CM | POA: Diagnosis not present

## 2019-05-18 DIAGNOSIS — R1310 Dysphagia, unspecified: Secondary | ICD-10-CM

## 2019-05-18 NOTE — Assessment & Plan Note (Signed)
She reports she is doing much better on Amitriptyline 75 mg daily. She reports less dysphagia and a drop in coughing and episodes of spasm from up to 45 episodes a day to a low of just 4 episodes yesterday

## 2019-05-18 NOTE — Assessment & Plan Note (Signed)
She notes the Amitriptyline has dried her out so she has stopped her Allegra, Xyzal and flonase and she reports her congestion is doing better she is seeing her allergist, Dr Paget soon and will discuss more at that time.
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
## 2019-05-18 NOTE — Assessment & Plan Note (Addendum)
Is feeling much better, no persistent concerning symptoms presently. Is now following with Dr Lyndel Safe and has a colonoscopy scheduled soon.

## 2019-05-25 ENCOUNTER — Encounter: Payer: Self-pay | Admitting: Allergy

## 2019-05-25 ENCOUNTER — Other Ambulatory Visit: Payer: Self-pay

## 2019-05-25 ENCOUNTER — Ambulatory Visit (INDEPENDENT_AMBULATORY_CARE_PROVIDER_SITE_OTHER): Payer: BC Managed Care – PPO | Admitting: Allergy

## 2019-05-25 DIAGNOSIS — J3089 Other allergic rhinitis: Secondary | ICD-10-CM | POA: Diagnosis not present

## 2019-05-25 DIAGNOSIS — R05 Cough: Secondary | ICD-10-CM

## 2019-05-25 DIAGNOSIS — K2100 Gastro-esophageal reflux disease with esophagitis, without bleeding: Secondary | ICD-10-CM

## 2019-05-25 DIAGNOSIS — R058 Other specified cough: Secondary | ICD-10-CM

## 2019-05-25 NOTE — Patient Instructions (Addendum)
Cough with post-nasal drainage   -Ccontinue avoidance measures for weed pollens, molds and dust mites   -She has noticed significant improvement with initiation of amitriptyline by her GI specialist. Currently without any significant nasal drainage or cough at this time.   -She will hold her Dymista, Xyzal and Singulair at this time   -Advised if she has any increase in drainage or cough that she can resume use of Xyzal first. She will let us know and we can ramp up therapy depending on symptom severity.   - have access to albuterol inhaler 2 puffs every 4-6 hours as needed for cough/wheeze/shortness of breath/chest tightness.  May use 15-20 minutes prior to activity.   Monitor frequency of use.    Reflux   - continue Dexilant for reflux control and recommendations by Dr. Lyndel Safe     Follow-up 6-12 months or sooner if needed

## 2019-05-25 NOTE — Progress Notes (Deleted)
    Follow-up Note  RE: SUMI AX MRN: [id number] DOB: 1970-10-21 Date of Office Visit: 05/25/2019   History of present illness: SHANAE BEDNAREK is a 49 y.o. female presenting today for follow-up of ***  {Blank single:19197::"Relevant historical results: ***"," "}  Review of systems: ROS  {Blank single:19197::" ","All other systems negative unless noted above in HPI"}  Past medical/social/surgical/family history have been reviewed and are unchanged unless specifically indicated below.  {Blank single:19197::"No changes"}  Medication List: Current Outpatient Medications  Medication Sig Dispense Refill  . amitriptyline (ELAVIL) 75 MG tablet Take 1 tablet (75 mg total) by mouth at bedtime. 30 tablet 11  . dexlansoprazole (DEXILANT) 60 MG capsule Take 1 capsule (60 mg total) by mouth daily. 30 capsule 11  . Misc Natural Products (TART CHERRY ADVANCED PO) Take 1,200 mg by mouth daily.    . Turmeric (QC TUMERIC COMPLEX) 500 MG CAPS Take 1 capsule by mouth daily.    . Vitamin D, Ergocalciferol, (DRISDOL) 1.25 MG (50000 UT) CAPS capsule Take 1 capsule (50,000 Units total) by mouth every 7 (seven) days. 12 capsule 0   No current facility-administered medications for this visit.     Known medication allergies: Allergies  Allergen Reactions  . Latex     sensitivity  . Sulfa Antibiotics Hives  . Adhesive [Tape] Rash     Physical examination: There were no vitals taken for this visit.  General: Alert, interactive, in no acute distress. HEENT: TMs pearly gray, turbinates {Blank single:19197::"non-edematous","edematous","edematous and pale","markedly edematous","markedly edematous and pale","moderately edematous","mildly edematous","minimally edematous"} {Blank single:19197::"with crusty discharge","with thick discharge","with clear discharge","without discharge"}, post-pharynx {Blank single:19197::"unremarkable","non erythematous","erythematous","markedly  erythematous","moderately erythematous","mildly erythematous"}. Neck: Supple without lymphadenopathy. Lungs: {Blank single:19197::"Decreased breath sounds with expiratory wheezing bilaterally","Mildly decreased breath sounds with expiratory wheezing bilaterally","Decreased breath sounds bilaterally without wheezing, rhonchi or rales","Mildly decreased breath sounds bilaterally without wheezing, rhonchi or rales","Clear to auscultation without wheezing, rhonchi or rales"}. {{Blank single:19197::"increased work of breathing","no increased work of breathing"}. CV: Normal S1, S2 without murmurs. Abdomen: Nondistended, nontender. Skin: {Blank single:19197::"Dry, erythematous, excoriated patches on the ***","Dry, hyperpigmented, thickened patches on the ***","Dry, mildly hyperpigmented, mildly thickened patches on the ***","Scattered erythematous urticarial type lesions primarily located *** , nonvesicular","Warm and dry, without lesions or rashes"}. Extremities:  No clubbing, cyanosis or edema. Neuro:   Grossly intact.  Diagnositics/Labs: Labs: ***  Spirometry: {Blank single:19197::"results normal","FEV1: ***, FVC: ***, ratio consistent with ***"}  Allergy testing: *** Allergy testing results were read and interpreted by provider, documented by clinical staff.   Assessment and plan: There are no Patient Instructions on file for this visit.  No follow-ups on file.  I appreciate the opportunity to take part in Naasia's care. Please do not hesitate to contact me with questions.  Sincerely,   Prudy Feeler, MD Allergy/Immunology [facility]

## 2019-05-25 NOTE — Progress Notes (Signed)
RE: Sherry Allen MRN: [id number] DOB: 06/15/1970 Date of Telemedicine Visit: 05/25/2019  Referring provider: Mosie Lukes, MD Primary care provider: Mosie Lukes, MD  Chief Complaint: Allergic Rhinitis  (no issues, have not been taking some of the medications her GI doc took her off)   Telemedicine Follow Up Visit via Telephone: I connected with Sherry Allen for a follow up on 05/25/19 by telephone and verified that I am speaking with the correct person using two identifiers.   I discussed the limitations, risks, security and privacy concerns of performing an evaluation and management service by telephone and the availability of in person appointments. I also discussed with the patient that there may be a patient responsible charge related to this service. The patient expressed understanding and agreed to proceed.  Patient is at home.  Provider is at the office.  Visit start time: 1114 Visit end time: [place] consent/check in by: Kirke Corin Medical consent and medical assistant/nurse: Lisabeth Pick S  History of Present Illness: She is a 49 y.o. female, who is being followed for cough with postnasal drainage and reflux. Her previous allergy televisit was on 08/23/2018 with Dr. Nelva Bush.   Her last visit was a televisit via August 23, 2018 by myself. She states she has been doing well since his visit without any major health changes, surgeries or hospitalizations. She states she has seen her GI specialist due to having many esophageal spasms throughout the day. She states she was having at least 30 to 40/day that was leading to cough. She states that the GI recommended she start amitriptyline which they start out at a lower dose which she states did not really have much effect but she is now up to 75 mg daily and states she is doing much better. She states she only counseled them about 5-9 esophageal spasms which is of big improvement. She continues on the Imogene. She states that her  GI specialist recommended she stop the Dymista, Xyzal and Singulair as she reported she was feeling too dry with the amitriptyline on board. She states since stopping the allergy-based medications that she has not had any increase in drainage or cough at this time.  Assessment and Plan: [name] is a 49 y.o. female with: Patient Instructions  Cough with post-nasal drainage   -Ccontinue avoidance measures for weed pollens, molds and dust mites   -She has noticed significant improvement with initiation of amitriptyline by her GI specialist. Currently without any significant nasal drainage or cough at this time.   -She will hold her Dymista, Xyzal and Singulair at this time   -Advised if she has any increase in drainage or cough that she can resume use of Xyzal first. She will let us know and we can ramp up therapy depending on symptom severity.   - have access to albuterol inhaler 2 puffs every 4-6 hours as needed for cough/wheeze/shortness of breath/chest tightness.  May use 15-20 minutes prior to activity.   Monitor frequency of use.    Reflux   - continue Dexilant for reflux control and recommendations by Dr. Lyndel Safe     Follow-up 6-12 months or sooner if needed  Diagnostics: None.  Medication List:  Current Outpatient Medications  Medication Sig Dispense Refill  . amitriptyline (ELAVIL) 75 MG tablet Take 1 tablet (75 mg total) by mouth at bedtime. 30 tablet 11  . dexlansoprazole (DEXILANT) 60 MG capsule Take 1 capsule (60 mg total) by mouth daily. 30 capsule 11  . Misc  Natural Products (TART CHERRY ADVANCED PO) Take 1,200 mg by mouth daily.    . Turmeric (QC TUMERIC COMPLEX) 500 MG CAPS Take 1 capsule by mouth daily.    . Vitamin D, Ergocalciferol, (DRISDOL) 1.25 MG (50000 UT) CAPS capsule Take 1 capsule (50,000 Units total) by mouth every 7 (seven) days. 12 capsule 0   No current facility-administered medications for this visit.   Allergies: Allergies  Allergen Reactions  . Latex       sensitivity  . Sulfa Antibiotics Hives  . Adhesive [Tape] Rash   I reviewed her past medical history, social history, family history, and environmental history and no significant changes have been reported from previous visit on 08/23/2018.  Review of Systems  Constitutional: Negative.   HENT: Negative.   Eyes: Negative.   Respiratory: Negative.   Cardiovascular: Negative.   Gastrointestinal: Negative.   Musculoskeletal: Negative.   Skin: Negative.   Neurological: Negative.    Objective: Physical Exam Not obtained as encounter was done via telephone.   Previous notes and tests were reviewed.  I discussed the assessment and treatment plan with the patient. The patient was provided an opportunity to ask questions and all were answered. The patient agreed with the plan and demonstrated an understanding of the instructions.   The patient was advised to call back or seek an in-person evaluation if the symptoms worsen or if the condition fails to improve as anticipated.  I provided 24 minutes of non-face-to-face time during this encounter.  It was my pleasure to participate in [place] care today. Please feel free to contact me with any questions or concerns.   Sincerely,  Bertil Brickey Charmian Muff, MD

## 2019-06-01 ENCOUNTER — Other Ambulatory Visit: Payer: Self-pay | Admitting: Family Medicine

## 2019-06-01 ENCOUNTER — Telehealth: Payer: Self-pay | Admitting: Gastroenterology

## 2019-06-01 NOTE — Telephone Encounter (Signed)
Lets increase amitriptyline to 75 mg p.o. nightly, 30, 6 refills RG

## 2019-06-01 NOTE — Telephone Encounter (Signed)
Pt called to update on her progress since dose for amtriptyline was increased. She states that she has been having 5 spasms a day with new dose.

## 2019-06-01 NOTE — Telephone Encounter (Signed)
Called and spoke with patent-patient reports that 5 spasms for the entire day is actually a good number for her-wanting to know if an increased dose is warranted or it she needs to stay with the 75 mg of Elavil for now?  please advise

## 2019-06-04 NOTE — Telephone Encounter (Signed)
Left message for patient to call back to the office;  

## 2019-06-05 NOTE — Telephone Encounter (Signed)
Patient returned your call, please call patient one more time.   

## 2019-06-05 NOTE — Telephone Encounter (Signed)
Called and spoke with patient-patient informed of MD recommendations; patient is agreeable with plan of care and has been scheduled for her COVID screening on 06/27/2019 at 8:00 am and also been scheduled for her colon on 06/29/2019 at 8:00 am; patient reports she was supposed to have scheduled this colon already and is wanting to proceed at this time; instructions have been mailed and sent to patient via MyChart for this procedure; Patient verbalized understanding of information/instructions;  Patient was advised to call the office at [phone] if questions/concerns arise;

## 2019-06-19 ENCOUNTER — Ambulatory Visit: Payer: BC Managed Care – PPO | Admitting: Family Medicine

## 2019-06-21 ENCOUNTER — Other Ambulatory Visit: Payer: Self-pay

## 2019-06-21 ENCOUNTER — Encounter: Payer: Self-pay | Admitting: Family Medicine

## 2019-06-21 ENCOUNTER — Ambulatory Visit: Payer: BC Managed Care – PPO | Admitting: Family Medicine

## 2019-06-21 DIAGNOSIS — M1612 Unilateral primary osteoarthritis, left hip: Secondary | ICD-10-CM

## 2019-06-21 MED ORDER — VENLAFAXINE HCL ER 37.5 MG PO CP24: 37.5000 mg | ORAL_CAPSULE | Freq: Every day | ORAL | 0 refills | Status: DC

## 2019-06-21 NOTE — Assessment & Plan Note (Signed)
Severe arthritis of the hip.  Minimal change from previous exam.  Patient is young and is inclined to avoid the possibility of surgical intervention but is having more difficulty with daily activities.  Discussed with patient about icing regimen, home exercises, and medications.  Medical management and change to Effexor.  Social determinants of health including patient needs to work and is unable to do surgical intervention at the moment.  We discussed the possibility of needing to consider referral to orthopedic surgery.  Follow-up again 4 to 6 weeks

## 2019-06-21 NOTE — Patient Instructions (Addendum)
Good to see you Effexor 37.5 daily Try to be active Continue vitamins See me again in 5-6 weeks to discuss referral
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
## 2019-06-27 ENCOUNTER — Ambulatory Visit (INDEPENDENT_AMBULATORY_CARE_PROVIDER_SITE_OTHER): Payer: BC Managed Care – PPO

## 2019-06-27 ENCOUNTER — Other Ambulatory Visit: Payer: Self-pay | Admitting: Gastroenterology

## 2019-06-27 ENCOUNTER — Telehealth: Payer: Self-pay | Admitting: Gastroenterology

## 2019-06-27 DIAGNOSIS — Z1159 Encounter for screening for other viral diseases: Secondary | ICD-10-CM

## 2019-06-27 LAB — SARS CORONAVIRUS 2 (TAT 6-24 HRS): SARS Coronavirus 2: NEGATIVE

## 2019-06-27 MED ORDER — NA SULFATE-K SULFATE-MG SULF 17.5-3.13-1.6 GM/177ML PO SOLN: 1.0000 | Freq: Once | ORAL | 0 refills | Status: AC

## 2019-06-27 NOTE — Telephone Encounter (Signed)
Called and spoke with patient-patient verified pharmacy and RX for Suprep has been sent; Patient advised to call back to the office at [phone] should questions/concerns arise;  Patient verbalized understanding of information/instructions;

## 2019-06-29 ENCOUNTER — Encounter: Payer: Self-pay | Admitting: Gastroenterology

## 2019-06-29 ENCOUNTER — Other Ambulatory Visit: Payer: Self-pay

## 2019-06-29 ENCOUNTER — Ambulatory Visit (AMBULATORY_SURGERY_CENTER): Payer: BC Managed Care – PPO | Admitting: Gastroenterology

## 2019-06-29 VITALS — BP 97/73 | HR 75 | Temp 96.6°F | Resp 19 | Ht 67.0 in | Wt 167.0 lb

## 2019-06-29 DIAGNOSIS — K5289 Other specified noninfective gastroenteritis and colitis: Secondary | ICD-10-CM | POA: Diagnosis not present

## 2019-06-29 DIAGNOSIS — D128 Benign neoplasm of rectum: Secondary | ICD-10-CM

## 2019-06-29 DIAGNOSIS — D125 Benign neoplasm of sigmoid colon: Secondary | ICD-10-CM

## 2019-06-29 DIAGNOSIS — R197 Diarrhea, unspecified: Secondary | ICD-10-CM | POA: Diagnosis not present

## 2019-06-29 DIAGNOSIS — K621 Rectal polyp: Secondary | ICD-10-CM

## 2019-06-29 MED ORDER — DEXILANT 60 MG PO CPDR: 60.0000 mg | DELAYED_RELEASE_CAPSULE | Freq: Every day | ORAL | 4 refills | Status: DC

## 2019-06-29 MED ORDER — SODIUM CHLORIDE 0.9 % IV SOLN: 500.0000 mL | Freq: Once | INTRAVENOUS | Status: DC

## 2019-06-29 NOTE — Progress Notes (Signed)
Report to PACU, RN, vss, BBS= Clear.  

## 2019-06-29 NOTE — Progress Notes (Signed)
VS by CW. Temp by JB. 

## 2019-06-29 NOTE — Op Note (Signed)
[facility] Patient Name: Sherry Allen Procedure Date: 06/29/2019 8:04 AM MRN: [id number] Endoscopist: Jackquline Denmark , MD Age: 49 Referring MD:  Date of Birth: 20-Feb-1971 Gender: Female Account #: [id number] Procedure:                Colonoscopy Indications:              History of colitis. Abnormal CT of the GI tract Medicines:                Monitored Anesthesia Care Procedure:                Pre-Anesthesia Assessment:                           - Prior to the procedure, a History and Physical                            was performed, and patient medications and                            allergies were reviewed. The patient's tolerance of                            previous anesthesia was also reviewed. The risks                            and benefits of the procedure and the sedation                            options and risks were discussed with the patient.                            All questions were answered, and informed consent                            was obtained. Prior Anticoagulants: The patient has                            taken no previous anticoagulant or antiplatelet                            agents. ASA Grade Assessment: I - A normal, healthy                            patient. After reviewing the risks and benefits,                            the patient was deemed in satisfactory condition to                            undergo the procedure.                           After obtaining informed consent, the colonoscope  was passed under direct vision. Throughout the                            procedure, the patient's blood pressure, pulse, and                            oxygen saturations were monitored continuously. The                            Colonoscope was introduced through the anus and                            advanced to the 2 cm into the ileum. The                            colonoscopy was performed without  difficulty. The                            patient tolerated the procedure well. The quality                            of the bowel preparation was good. The terminal                            ileum, ileocecal valve, appendiceal orifice, and                            rectum were photographed. Scope In: 8:09:44 AM Scope Out: 8:31:39 AM Scope Withdrawal Time: 0 hours 16 minutes 42 seconds  Total Procedure Duration: 0 hours 21 minutes 55 seconds  Findings:                 The colon (entire examined portion) appeared                            normal. Biopsies were taken with a cold forceps for                            histology.                           A 4 mm polyp was found in the mid sigmoid colon.                            The polyp was sessile. The polyp was removed with a                            cold biopsy forceps. Resection and retrieval were                            complete.                           Two sessile polyps were found in the ano-rectum at  dentate line. The polyps were 4 to 6 mm in size.                            These polyps were removed with a hot snare.                            Resection and retrieval were complete. Small                            internal hemorrhoids were also noted on the                            retroflexed examination.                           The terminal ileum appeared normal.                           The exam was otherwise without abnormality on                            direct and retroflexion views. Complications:            No immediate complications. Estimated Blood Loss:     Estimated blood loss: none. Impression:               -Colonic polyps s/p polypectomy.                           -Otherwise normal colonoscopy to TI. No colitis. Recommendation:           - Patient has a contact number available for                            emergencies. The signs and symptoms of potential                             delayed complications were discussed with the                            patient. Return to normal activities tomorrow.                            Written discharge instructions were provided to the                            patient.                           - Resume previous diet.                           - Continue present medications.                           - Await pathology results.                           -  Repeat colonoscopy for surveillance based on                            pathology results.                           - Return to GI clinic PRN.                           - D/W Annabelle Harman, MD 06/29/2019 8:39:00 AM This report has been signed electronically.

## 2019-06-29 NOTE — Patient Instructions (Signed)
YOU HAD AN ENDOSCOPIC PROCEDURE TODAY AT THE [facility]:   Refer to the procedure report that was given to you for any specific questions about what was found during the examination.  If the procedure report does not answer your questions, please call your gastroenterologist to clarify.  If you requested that your care partner not be given the details of your procedure findings, then the procedure report has been included in a sealed envelope for you to review at your convenience later.  YOU SHOULD EXPECT: Some feelings of bloating in the abdomen. Passage of more gas than usual.  Walking can help get rid of the air that was put into your GI tract during the procedure and reduce the bloating. If you had a lower endoscopy (such as a colonoscopy or flexible sigmoidoscopy) you may notice spotting of blood in your stool or on the toilet paper. If you underwent a bowel prep for your procedure, you may not have a normal bowel movement for a few days.  Please Note:  You might notice some irritation and congestion in your nose or some drainage.  This is from the oxygen used during your procedure.  There is no need for concern and it should clear up in a day or so.  SYMPTOMS TO REPORT IMMEDIATELY:   Following lower endoscopy (colonoscopy or flexible sigmoidoscopy):  Excessive amounts of blood in the stool  Significant tenderness or worsening of abdominal pains  Swelling of the abdomen that is new, acute  Fever of 100F or higher  For urgent or emergent issues, a gastroenterologist can be reached at any hour by calling [phone]. Do not use MyChart messaging for urgent concerns.    DIET:  We do recommend a small meal at first, but then you may proceed to your regular diet.  Drink plenty of fluids but you should avoid alcoholic beverages for 24 hours.  ACTIVITY:  You should plan to take it easy for the rest of today and you should NOT DRIVE or use heavy machinery until tomorrow (because  of the sedation medicines used during the test).    FOLLOW UP: Our staff will call the number listed on your records 48-72 hours following your procedure to check on you and address any questions or concerns that you may have regarding the information given to you following your procedure. If we do not reach you, we will leave a message.  We will attempt to reach you two times.  During this call, we will ask if you have developed any symptoms of COVID 19. If you develop any symptoms (ie: fever, flu-like symptoms, shortness of breath, cough etc.) before then, please call [phone].  If you test positive for Covid 19 in the 2 weeks post procedure, please call and report this information to us.    If any biopsies were taken you will be contacted by phone or by letter within the next 1-3 weeks.  Please call us at [phone] if you have not heard about the biopsies in 3 weeks.    SIGNATURES/CONFIDENTIALITY: You and/or your care partner have signed paperwork which will be entered into your electronic medical record.  These signatures attest to the fact that that the information above on your After Visit Summary has been reviewed and is understood.  Full responsibility of the confidentiality of this discharge information lies with you and/or your care-partner. 

## 2019-07-03 ENCOUNTER — Telehealth: Payer: Self-pay

## 2019-07-03 NOTE — Telephone Encounter (Signed)
  Follow up Call-  Call back number 06/29/2019 01/20/2018  Post procedure Call Back phone  # [phone]  Permission to leave phone message Yes Yes  Some recent data might be hidden     Patient questions:  Do you have a fever, pain , or abdominal swelling? No. Pain Score  0 *  Have you tolerated food without any problems? Yes.    Have you been able to return to your normal activities? Yes.    Do you have any questions about your discharge instructions: Diet   No. Medications  No. Follow up visit  No.  Do you have questions or concerns about your Care? No.  Actions: * If pain score is 4 or above: 1. No action needed, pain <4.Have you developed a fever since your procedure? no  2.   Have you had an respiratory symptoms (SOB or cough) since your procedure? no  3.   Have you tested positive for COVID 19 since your procedure no  4.   Have you had any family members/close contacts diagnosed with the COVID 19 since your procedure?  no   If yes to any of these questions please route to Joylene John, RN and Alphonsa Gin, Therapist, sports.

## 2019-07-04 DIAGNOSIS — E031 Congenital hypothyroidism without goiter: Secondary | ICD-10-CM | POA: Diagnosis not present

## 2019-07-08 ENCOUNTER — Encounter: Payer: Self-pay | Admitting: Gastroenterology

## 2019-07-26 ENCOUNTER — Other Ambulatory Visit: Payer: Self-pay

## 2019-07-26 ENCOUNTER — Encounter: Payer: Self-pay | Admitting: Family Medicine

## 2019-07-26 ENCOUNTER — Ambulatory Visit: Payer: BC Managed Care – PPO | Admitting: Family Medicine

## 2019-07-26 DIAGNOSIS — M1612 Unilateral primary osteoarthritis, left hip: Secondary | ICD-10-CM

## 2019-07-26 MED ORDER — VENLAFAXINE HCL ER 75 MG PO CP24: 75.0000 mg | ORAL_CAPSULE | Freq: Every day | ORAL | 3 refills | Status: DC

## 2019-07-26 NOTE — Assessment & Plan Note (Signed)
Reviewing patient's chart and discussing with different treatment options was greater than 35 minutes with patient today.  Patient has been stable at the moment and no significant wheezing.  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  Increase Effexor to 75 mg.  Worsening symptoms concerning future considerations include the possibility of corticosteroids or PRP.  Patient wants to hold on any type of surgical intervention as long as possible.  Follow-up again in 2 months
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
## 2019-07-26 NOTE — Patient Instructions (Signed)
Increase Effexor to 75 mg Con't conservative therapy for the next 6-8 weeks and follow-up then. If not improving, will consider injection.

## 2019-09-13 ENCOUNTER — Other Ambulatory Visit: Payer: Self-pay

## 2019-09-13 ENCOUNTER — Ambulatory Visit: Payer: BC Managed Care – PPO | Admitting: Family Medicine

## 2019-09-13 ENCOUNTER — Encounter: Payer: Self-pay | Admitting: Family Medicine

## 2019-09-13 VITALS — BP 108/62 | HR 94 | Ht 67.0 in | Wt 165.0 lb

## 2019-09-13 DIAGNOSIS — M1612 Unilateral primary osteoarthritis, left hip: Secondary | ICD-10-CM

## 2019-09-13 MED ORDER — VENLAFAXINE HCL ER 37.5 MG PO CP24: 37.5000 mg | ORAL_CAPSULE | Freq: Every day | ORAL | 0 refills | Status: DC

## 2019-09-13 MED ORDER — VITAMIN D (ERGOCALCIFEROL) 1.25 MG (50000 UNIT) PO CAPS: 50000.0000 [IU] | ORAL_CAPSULE | ORAL | 0 refills | Status: DC

## 2019-09-13 NOTE — Patient Instructions (Signed)
Effexor 37.5 discontinue after month Dr. Darin Engels [facility] Will hold on PRP Send message after speaking with Dr. Tylene Fantasia

## 2019-09-13 NOTE — Assessment & Plan Note (Signed)
Patient is having a chronic problem with exacerbation.  Worsening over the course of time here.  Has had surgical intervention greater than 30 years ago and unfortunately with patient's most recent imaging shows severe bone-on-bone osteoarthritic changes of the left hip.  At this point I do think patient will likely need surgical intervention.  Will be referred over to orthopedic surgeon.  I do believe patient will do well.  We will try to start to titrate down off the Effexor because patient does not feel like it is been beneficial.  Refilled vitamin D supplementation.  We discussed the only other possibilities are corticosteroid injection or possible PRP but patient does have titanium plate of the acetabulum that could increase risk of infectious etiology.  Patient understands that and would consider the possibility of this if surgical intervention was not what I recommended.  Patient will follow up with me more on an as-needed basis.  Total time with patient today 32 minutes
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
## 2019-09-28 ENCOUNTER — Ambulatory Visit: Payer: BC Managed Care – PPO | Admitting: Gastroenterology

## 2019-09-28 ENCOUNTER — Encounter: Payer: Self-pay | Admitting: Gastroenterology

## 2019-09-28 VITALS — BP 106/80 | HR 83 | Temp 97.8°F | Ht 67.0 in | Wt 165.0 lb

## 2019-09-28 DIAGNOSIS — K219 Gastro-esophageal reflux disease without esophagitis: Secondary | ICD-10-CM

## 2019-09-28 DIAGNOSIS — K449 Diaphragmatic hernia without obstruction or gangrene: Secondary | ICD-10-CM | POA: Diagnosis not present

## 2019-09-28 DIAGNOSIS — R059 Cough, unspecified: Secondary | ICD-10-CM

## 2019-09-28 DIAGNOSIS — R05 Cough: Secondary | ICD-10-CM | POA: Diagnosis not present

## 2019-09-28 MED ORDER — AMITRIPTYLINE HCL 150 MG PO TABS: 150.0000 mg | ORAL_TABLET | Freq: Every day | ORAL | 11 refills | Status: DC
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
## 2019-09-28 NOTE — Patient Instructions (Signed)
If you are age 49 or older, your body mass index should be between 23-30. Your Body mass index is 25.84 kg/m. If this is out of the aforementioned range listed, please consider follow up with your Primary Care Provider.  If you are age 37 or younger, your body mass index should be between 19-25. Your Body mass index is 25.84 kg/m. If this is out of the aformentioned range listed, please consider follow up with your Primary Care Provider.   We have sent medications to your local pharmacy   Call if you have any questions please

## 2019-10-09 DIAGNOSIS — D225 Melanocytic nevi of trunk: Secondary | ICD-10-CM | POA: Diagnosis not present

## 2019-10-09 DIAGNOSIS — L821 Other seborrheic keratosis: Secondary | ICD-10-CM | POA: Diagnosis not present

## 2019-10-09 DIAGNOSIS — D1801 Hemangioma of skin and subcutaneous tissue: Secondary | ICD-10-CM | POA: Diagnosis not present

## 2019-10-09 DIAGNOSIS — L814 Other melanin hyperpigmentation: Secondary | ICD-10-CM | POA: Diagnosis not present

## 2019-10-15 DIAGNOSIS — M1652 Unilateral post-traumatic osteoarthritis, left hip: Secondary | ICD-10-CM | POA: Diagnosis not present

## 2019-10-18 ENCOUNTER — Ambulatory Visit: Payer: BC Managed Care – PPO | Admitting: Family Medicine

## 2019-11-07 DIAGNOSIS — E031 Congenital hypothyroidism without goiter: Secondary | ICD-10-CM | POA: Diagnosis not present

## 2019-11-14 ENCOUNTER — Encounter: Payer: Self-pay | Admitting: Family Medicine

## 2019-11-14 ENCOUNTER — Telehealth (INDEPENDENT_AMBULATORY_CARE_PROVIDER_SITE_OTHER): Payer: BC Managed Care – PPO | Admitting: Family Medicine

## 2019-11-14 ENCOUNTER — Other Ambulatory Visit: Payer: Self-pay

## 2019-11-14 VITALS — BP 110/70 | HR 72 | Ht 67.0 in | Wt 156.0 lb

## 2019-11-14 DIAGNOSIS — Z01818 Encounter for other preprocedural examination: Secondary | ICD-10-CM

## 2019-11-14 DIAGNOSIS — M1612 Unilateral primary osteoarthritis, left hip: Secondary | ICD-10-CM

## 2019-11-14 DIAGNOSIS — K219 Gastro-esophageal reflux disease without esophagitis: Secondary | ICD-10-CM | POA: Diagnosis not present

## 2019-11-14 DIAGNOSIS — R252 Cramp and spasm: Secondary | ICD-10-CM | POA: Diagnosis not present

## 2019-11-14 DIAGNOSIS — R739 Hyperglycemia, unspecified: Secondary | ICD-10-CM | POA: Diagnosis not present

## 2019-11-14 DIAGNOSIS — D72829 Elevated white blood cell count, unspecified: Secondary | ICD-10-CM
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
## 2019-11-14 NOTE — Assessment & Plan Note (Addendum)
She is scheduled for a left anterior hip arthroplasty under spinal anesthesia on 12/13/19 with Dr Kathi Ludwig. She is anxious to proceed with surgery due to her level of pain and debility. She is advised not to start any new OTC supplements or meds prior to surgery. No NSAIDs the week prior to surgery. She will proceed with pre op labs and report if she has any clinical changes. She is cleared for surgery unless her labs come back notably abnormal or she experiences a clinical change. Spent 20 minutes discussing risks and benefits of surgery and plan of care.

## 2019-11-15 ENCOUNTER — Telehealth: Payer: Self-pay | Admitting: Family Medicine

## 2019-11-15 NOTE — Telephone Encounter (Signed)
We did received paperwork.  Patient did video visit on 11/14/19 and we are just waiting on her to get labs done.

## 2019-11-15 NOTE — Telephone Encounter (Signed)
Wells Guiles From [facility] docuementation sent over for medical clearance and their office has not receive information back .  Per Rebecca,they will resend documentation.

## 2019-11-19 NOTE — Telephone Encounter (Signed)
Followed up with patient and she stated that she will go this week to get labs done.  I also called Wells Guiles at [facility] and left a message that we did get form and we are just waiting for patient to get labs done because she had a video visit and as soon as she get them done we will send clearance over.

## 2019-11-21 ENCOUNTER — Other Ambulatory Visit (INDEPENDENT_AMBULATORY_CARE_PROVIDER_SITE_OTHER): Payer: BC Managed Care – PPO

## 2019-11-21 DIAGNOSIS — R739 Hyperglycemia, unspecified: Secondary | ICD-10-CM

## 2019-11-21 DIAGNOSIS — K219 Gastro-esophageal reflux disease without esophagitis: Secondary | ICD-10-CM

## 2019-11-21 DIAGNOSIS — D72829 Elevated white blood cell count, unspecified: Secondary | ICD-10-CM

## 2019-11-21 DIAGNOSIS — Z01818 Encounter for other preprocedural examination: Secondary | ICD-10-CM

## 2019-11-21 DIAGNOSIS — R252 Cramp and spasm: Secondary | ICD-10-CM

## 2019-11-21 LAB — COMPREHENSIVE METABOLIC PANEL
ALT: 21 U/L (ref 0–35)
AST: 17 U/L (ref 0–37)
Albumin: 4.5 g/dL (ref 3.5–5.2)
Alkaline Phosphatase: 45 U/L (ref 39–117)
BUN: 11 mg/dL (ref 6–23)
CO2: 31 mEq/L (ref 19–32)
Calcium: 9.5 mg/dL (ref 8.4–10.5)
Chloride: 102 mEq/L (ref 96–112)
Creatinine, Ser: 0.69 mg/dL (ref 0.40–1.20)
GFR: 90.51 mL/min (ref >60.00)
Glucose, Bld: 100 mg/dL — ABNORMAL HIGH (ref 70–99)
Potassium: 4.3 mEq/L (ref 3.5–5.1)
Sodium: 137 mEq/L (ref 135–145)
Total Bilirubin: 0.4 mg/dL (ref 0.2–1.2)
Total Protein: 7.4 g/dL (ref 6.0–8.3)

## 2019-11-21 LAB — CBC WITH DIFFERENTIAL/PLATELET
Basophils Absolute: 0 10*3/uL (ref 0.0–0.1)
Basophils Relative: 0.7 % (ref 0.0–3.0)
Eosinophils Absolute: 0.2 10*3/uL (ref 0.0–0.7)
Eosinophils Relative: 4.7 % (ref 0.0–5.0)
HCT: 39.3 % (ref 36.0–46.0)
Hemoglobin: 13.2 g/dL (ref 12.0–15.0)
Lymphocytes Relative: 26.3 % (ref 12.0–46.0)
Lymphs Abs: 1.2 10*3/uL (ref 0.7–4.0)
MCHC: 33.6 g/dL (ref 30.0–36.0)
MCV: 88.6 fl (ref 78.0–100.0)
Monocytes Absolute: 0.4 10*3/uL (ref 0.1–1.0)
Monocytes Relative: 9.3 % (ref 3.0–12.0)
Neutro Abs: 2.8 10*3/uL (ref 1.4–7.7)
Neutrophils Relative %: 59 % (ref 43.0–77.0)
Platelets: 226 10*3/uL (ref 150.0–400.0)
RBC: 4.43 Mil/uL (ref 3.87–5.11)
RDW: 12.9 % (ref 11.5–15.5)
WBC: 4.7 10*3/uL (ref 4.0–10.5)

## 2019-11-21 LAB — HEMOGLOBIN A1C: Hgb A1c MFr Bld: 5.6 % (ref 4.6–6.5)

## 2019-11-21 LAB — PROTIME-INR
INR: 1.1 ratio — ABNORMAL HIGH (ref 0.8–1.0)
Prothrombin Time: 12.4 s (ref 9.6–13.1)

## 2019-11-21 LAB — TSH: TSH: 0.84 u[IU]/mL (ref 0.35–4.50)

## 2019-11-22 ENCOUNTER — Encounter: Payer: Self-pay | Admitting: *Deleted

## 2019-11-22 NOTE — Telephone Encounter (Signed)
Form faxed to [facility]

## 2019-11-26 DIAGNOSIS — Z6824 Body mass index (BMI) 24.0-24.9, adult: Secondary | ICD-10-CM | POA: Diagnosis not present

## 2019-11-26 DIAGNOSIS — Z01419 Encounter for gynecological examination (general) (routine) without abnormal findings: Secondary | ICD-10-CM | POA: Diagnosis not present

## 2019-12-07 DIAGNOSIS — M1652 Unilateral post-traumatic osteoarthritis, left hip: Secondary | ICD-10-CM | POA: Diagnosis not present

## 2019-12-11 ENCOUNTER — Encounter: Payer: BC Managed Care – PPO | Admitting: Medical

## 2019-12-13 DIAGNOSIS — Z96642 Presence of left artificial hip joint: Secondary | ICD-10-CM | POA: Diagnosis not present

## 2019-12-13 DIAGNOSIS — M1612 Unilateral primary osteoarthritis, left hip: Secondary | ICD-10-CM | POA: Diagnosis not present

## 2019-12-14 ENCOUNTER — Ambulatory Visit (INDEPENDENT_AMBULATORY_CARE_PROVIDER_SITE_OTHER): Payer: BC Managed Care – PPO | Admitting: Allergy

## 2019-12-14 ENCOUNTER — Encounter: Payer: Self-pay | Admitting: Allergy

## 2019-12-14 ENCOUNTER — Other Ambulatory Visit: Payer: Self-pay

## 2019-12-14 DIAGNOSIS — J3089 Other allergic rhinitis: Secondary | ICD-10-CM | POA: Diagnosis not present

## 2019-12-14 DIAGNOSIS — R05 Cough: Secondary | ICD-10-CM | POA: Diagnosis not present

## 2019-12-14 DIAGNOSIS — R058 Other specified cough: Secondary | ICD-10-CM

## 2019-12-14 DIAGNOSIS — K2100 Gastro-esophageal reflux disease with esophagitis, without bleeding: Secondary | ICD-10-CM

## 2019-12-14 NOTE — Patient Instructions (Addendum)
Cough with post-nasal drainage   -Ccontinue avoidance measures for weed pollens, molds and dust mites   -She has noticed continued improvement of nasal drainage and cough on amitriptyline initiated by her GI specialist which she continues on   -Use Dymista and Xyzal as needed if nasal drainage or cough increases   - have access to albuterol inhaler 2 puffs every 4-6 hours as needed for cough/wheeze/shortness of breath/chest tightness.  May use 15-20 minutes prior to activity.   Monitor frequency of use.    Reflux   - continue Dexilant for reflux control and recommendations by Dr. Lyndel Safe     Follow-up 12 months or sooner if needed

## 2019-12-14 NOTE — Progress Notes (Signed)
RE: JUNELL CULLIFER MRN: [id number] DOB: [birth year] Date of Telemedicine Visit: 12/14/2019  Referring provider: Mosie Lukes, MD Primary care provider: Mosie Lukes, MD  Chief Complaint: Cough   Telemedicine Follow Up Visit via Telephone: I connected with Smitty Pluck for a follow up on 12/14/19 by telephone and verified that I am speaking with the correct person using two identifiers.   I discussed the limitations, risks, security and privacy concerns of performing an evaluation and management service by telephone and the availability of in person appointments. I also discussed with the patient that there may be a patient responsible charge related to this service. The patient expressed understanding and agreed to proceed.  Patient is at home.  Provider is at the office.  Visit start time: 76 Visit end time: [place] consent/check in by: Geoffery Spruce Medical consent and medical assistant/nurse: Evette Georges  History of Present Illness: She is a 49 y.o. female, who is being followed for cough with postnasal drainage and reflux. Her previous allergy visit was on 2019-05-25 via telehealth with Dr. Nelva Bush.   She states she has been doing well since her last visit in regards to her nasal drainage, cough and reflux.  She states she hasn't been having any significant symptoms.  She continues to take amitriptyline which she states has been very helpful in decreasing nasal drainage and her cough.  She states around March through May she did need to use the Xyzal due to increase in pollen and allergy symptoms but she was able to wean off of that by summer.  She still has Dymista but she will use if she has any increase in nasal drainage but she hasn't and thus has not needed to use.  She also has access to her albuterol inhaler but she hasn't needed to use this either.  She continues to take Dexilant 60 mg daily for control of her reflux and follows with Dr. Lyndel Safe. She did have a left hip  replacement yesterday and is doing well with recovery and is weightbearing and already up and moving with a walker.  Assessment and Plan: Larrie is a 49 y.o. female with:   Cough with post-nasal drainage   -Ccontinue avoidance measures for weed pollens, molds and dust mites   -She has noticed continued improvement of nasal drainage and cough on amitriptyline initiated by her GI specialist which she continues on   -Use Dymista and Xyzal as needed if nasal drainage or cough increases   - have access to albuterol inhaler 2 puffs every 4-6 hours as needed for cough/wheeze/shortness of breath/chest tightness.  May use 15-20 minutes prior to activity.   Monitor frequency of use.    Reflux   - continue Dexilant for reflux control and recommendations by Dr. Lyndel Safe     Follow-up 12 months or sooner if needed  Diagnostics: None.  Medication List:  Current Outpatient Medications  Medication Sig Dispense Refill  . amitriptyline (ELAVIL) 150 MG tablet Take 1 tablet (150 mg total) by mouth at bedtime. 30 tablet 11  . aspirin 81 MG chewable tablet Chew 81 mg by mouth 2 (two) times daily.    Marland Kitchen dexlansoprazole (DEXILANT) 60 MG capsule Take 1 capsule (60 mg total) by mouth daily. 90 capsule 4  . HYDROcodone-acetaminophen (NORCO/VICODIN) 5-325 MG tablet Take 1-2 tablets by mouth every 6 (six) hours as needed.    . Misc Natural Products (TART CHERRY ADVANCED PO) Take 1,200 mg by mouth daily.    Marland Kitchen tiZANidine (  ZANAFLEX) 4 MG tablet Take 4 mg by mouth every 6 (six) hours as needed.     No current facility-administered medications for this visit.   Allergies: Allergies  Allergen Reactions  . Latex     sensitivity  . Sulfa Antibiotics Hives  . Adhesive [Tape] Rash   I reviewed her past medical history, social history, family history, and environmental history and no significant changes have been reported from previous visit on 2019-05-25.  Review of Systems  Constitutional: Negative.   HENT:  Negative.   Eyes: Negative.   Respiratory: Negative.   Cardiovascular: Negative.   Gastrointestinal: Negative.   Skin: Negative.   Neurological: Negative.   All other systems negative unless noted in HPI  Objective: Physical Exam Not obtained as encounter was done via telephone.   Previous notes and tests were reviewed.  I discussed the assessment and treatment plan with the patient. The patient was provided an opportunity to ask questions and all were answered. The patient agreed with the plan and demonstrated an understanding of the instructions.   The patient was advised to call back or seek an in-person evaluation if the symptoms worsen or if the condition fails to improve as anticipated.  I provided 9 minutes of non-face-to-face time during this encounter.  It was my pleasure to participate in [place] care today. Please feel free to contact me with any questions or concerns.   Sincerely,  Dilyn Smiles Charmian Muff, MD

## 2019-12-24 DIAGNOSIS — Z96642 Presence of left artificial hip joint: Secondary | ICD-10-CM | POA: Diagnosis not present

## 2019-12-24 DIAGNOSIS — Z9889 Other specified postprocedural states: Secondary | ICD-10-CM | POA: Diagnosis not present

## 2020-01-10 ENCOUNTER — Other Ambulatory Visit: Payer: Self-pay | Admitting: Obstetrics and Gynecology

## 2020-01-10 DIAGNOSIS — Z1231 Encounter for screening mammogram for malignant neoplasm of breast: Secondary | ICD-10-CM

## 2020-01-11 DIAGNOSIS — R87619 Unspecified abnormal cytological findings in specimens from cervix uteri: Secondary | ICD-10-CM | POA: Diagnosis not present

## 2020-01-11 DIAGNOSIS — N870 Mild cervical dysplasia: Secondary | ICD-10-CM | POA: Diagnosis not present

## 2020-01-14 DIAGNOSIS — Z96642 Presence of left artificial hip joint: Secondary | ICD-10-CM | POA: Diagnosis not present

## 2020-01-25 ENCOUNTER — Ambulatory Visit
Admission: RE | Admit: 2020-01-25 | Discharge: 2020-01-25 | Disposition: A | Discharge status: Home / Self Care | Payer: BC Managed Care – PPO | Source: Ambulatory Visit | Attending: Obstetrics and Gynecology | Admitting: Obstetrics and Gynecology

## 2020-01-25 ENCOUNTER — Other Ambulatory Visit: Payer: Self-pay

## 2020-01-25 DIAGNOSIS — Z1231 Encounter for screening mammogram for malignant neoplasm of breast: Secondary | ICD-10-CM | POA: Diagnosis not present

## 2020-02-07 DIAGNOSIS — R87610 Atypical squamous cells of undetermined significance on cytologic smear of cervix (ASC-US): Secondary | ICD-10-CM | POA: Diagnosis not present

## 2020-03-06 DIAGNOSIS — E031 Congenital hypothyroidism without goiter: Secondary | ICD-10-CM | POA: Diagnosis not present

## 2020-03-17 DIAGNOSIS — Z96642 Presence of left artificial hip joint: Secondary | ICD-10-CM | POA: Diagnosis not present

## 2020-06-05 ENCOUNTER — Telehealth: Payer: BC Managed Care – PPO | Admitting: Family Medicine

## 2020-06-10 ENCOUNTER — Encounter: Payer: BC Managed Care – PPO | Admitting: Family Medicine

## 2020-06-13 DIAGNOSIS — R87610 Atypical squamous cells of undetermined significance on cytologic smear of cervix (ASC-US): Secondary | ICD-10-CM | POA: Diagnosis not present

## 2020-06-13 LAB — RESULTS CONSOLE HPV: CHL HPV: POSITIVE

## 2020-06-13 LAB — HM PAP SMEAR

## 2020-06-17 ENCOUNTER — Encounter: Payer: BC Managed Care – PPO | Admitting: Family

## 2020-07-08 ENCOUNTER — Encounter: Payer: BC Managed Care – PPO | Admitting: Family

## 2020-07-24 DIAGNOSIS — E031 Congenital hypothyroidism without goiter: Secondary | ICD-10-CM | POA: Diagnosis not present

## 2020-09-02 ENCOUNTER — Other Ambulatory Visit: Payer: Self-pay

## 2020-09-02 ENCOUNTER — Ambulatory Visit (INDEPENDENT_AMBULATORY_CARE_PROVIDER_SITE_OTHER): Payer: BC Managed Care – PPO | Admitting: Family

## 2020-09-02 ENCOUNTER — Telehealth: Payer: Self-pay | Admitting: Family

## 2020-09-02 ENCOUNTER — Encounter: Payer: Self-pay | Admitting: Family

## 2020-09-02 VITALS — BP 113/62 | HR 57 | Temp 98.5°F | Resp 16 | Ht 67.0 in | Wt 151.0 lb

## 2020-09-02 DIAGNOSIS — Z23 Encounter for immunization: Secondary | ICD-10-CM

## 2020-09-02 DIAGNOSIS — R739 Hyperglycemia, unspecified: Secondary | ICD-10-CM | POA: Diagnosis not present

## 2020-09-02 DIAGNOSIS — R002 Palpitations: Secondary | ICD-10-CM

## 2020-09-02 DIAGNOSIS — Z0000 Encounter for general adult medical examination without abnormal findings: Secondary | ICD-10-CM
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
## 2020-09-02 NOTE — Patient Instructions (Signed)
Please complete lab work prior to leaving.   

## 2020-09-02 NOTE — Assessment & Plan Note (Addendum)
New. Will check CBC, CMET, TSH.  EKG tracing is personally reviewed.  EKG notes NSR (with RBBB- seen on previous EKG).  No acute changes. If lab work unrevealing and symptoms persist, we discussed referral to cardiology.

## 2020-09-02 NOTE — Telephone Encounter (Signed)
Please call Dr. Helane Rima and request copy of recent pap.

## 2020-09-02 NOTE — Assessment & Plan Note (Addendum)
Continue current diet.  Pans to start a walking routine. Td today.  Mammo/pap up to date.  Vision up to date. Dental up to date. R ear was flushed by CMA, pt tolerated well and reported improved hearing from the right ear.

## 2020-09-02 NOTE — Telephone Encounter (Signed)
[facility] 

## 2020-09-03 ENCOUNTER — Telehealth: Payer: Self-pay | Admitting: Family

## 2020-09-03 LAB — CBC WITH DIFFERENTIAL/PLATELET
Basophils Absolute: 0 10*3/uL (ref 0.0–0.1)
Basophils Relative: 0.9 % (ref 0.0–3.0)
Eosinophils Absolute: 0.2 10*3/uL (ref 0.0–0.7)
Eosinophils Relative: 3.4 % (ref 0.0–5.0)
HCT: 38.4 % (ref 36.0–46.0)
Hemoglobin: 13.2 g/dL (ref 12.0–15.0)
Lymphocytes Relative: 34.9 % (ref 12.0–46.0)
Lymphs Abs: 1.8 10*3/uL (ref 0.7–4.0)
MCHC: 34.4 g/dL (ref 30.0–36.0)
MCV: 86.7 fl (ref 78.0–100.0)
Monocytes Absolute: 0.4 10*3/uL (ref 0.1–1.0)
Monocytes Relative: 7.4 % (ref 3.0–12.0)
Neutro Abs: 2.7 10*3/uL (ref 1.4–7.7)
Neutrophils Relative %: 53.4 % (ref 43.0–77.0)
Platelets: 301 10*3/uL (ref 150.0–400.0)
RBC: 4.43 Mil/uL (ref 3.87–5.11)
RDW: 12.7 % (ref 11.5–15.5)
WBC: 5.1 10*3/uL (ref 4.0–10.5)

## 2020-09-03 LAB — COMPREHENSIVE METABOLIC PANEL
ALT: 28 U/L (ref 0–35)
AST: 19 U/L (ref 0–37)
Albumin: 4.6 g/dL (ref 3.5–5.2)
Alkaline Phosphatase: 52 U/L (ref 39–117)
BUN: 11 mg/dL (ref 6–23)
CO2: 28 mEq/L (ref 19–32)
Calcium: 9.7 mg/dL (ref 8.4–10.5)
Chloride: 101 mEq/L (ref 96–112)
Creatinine, Ser: 0.56 mg/dL (ref 0.40–1.20)
GFR: 107.06 mL/min (ref >60.00)
Glucose, Bld: 82 mg/dL (ref 70–99)
Potassium: 3.8 mEq/L (ref 3.5–5.1)
Sodium: 139 mEq/L (ref 135–145)
Total Bilirubin: 0.7 mg/dL (ref 0.2–1.2)
Total Protein: 7.1 g/dL (ref 6.0–8.3)

## 2020-09-03 LAB — HEMOGLOBIN A1C: Hgb A1c MFr Bld: 6 % (ref 4.6–6.5)

## 2020-09-03 LAB — TSH: TSH: 1.74 u[IU]/mL (ref 0.35–4.50)

## 2020-09-03 NOTE — Telephone Encounter (Signed)
Patient advised of results and provider's advise to work on her diet

## 2020-09-03 NOTE — Telephone Encounter (Signed)
Please advise pt that sugar has risen slightly and is in the "borderline diabetes" range. Please work on avoiding concentrated sweets, and limiting white carbs (rice/bread/pasta/potatoes). Instead substitute whole grain versions with reasonable portions.  Blood count, kidney function, thyroid function, and electrolytes are all normal.

## 2020-09-15 DIAGNOSIS — R202 Paresthesia of skin: Secondary | ICD-10-CM | POA: Diagnosis not present

## 2020-09-15 DIAGNOSIS — R030 Elevated blood-pressure reading, without diagnosis of hypertension: Secondary | ICD-10-CM | POA: Diagnosis not present

## 2020-09-15 DIAGNOSIS — M549 Dorsalgia, unspecified: Secondary | ICD-10-CM | POA: Diagnosis not present

## 2020-09-17 ENCOUNTER — Telehealth: Payer: Self-pay

## 2020-09-17 ENCOUNTER — Encounter: Payer: Self-pay | Admitting: Family Medicine

## 2020-09-17 DIAGNOSIS — W57XXXA Bitten or stung by nonvenomous insect and other nonvenomous arthropods, initial encounter: Secondary | ICD-10-CM

## 2020-09-17 NOTE — Telephone Encounter (Signed)
Called to schedule a lab appointment for Friday or Monday.

## 2020-09-17 NOTE — Telephone Encounter (Signed)
done

## 2020-09-19 ENCOUNTER — Other Ambulatory Visit: Payer: Self-pay

## 2020-09-19 ENCOUNTER — Other Ambulatory Visit (INDEPENDENT_AMBULATORY_CARE_PROVIDER_SITE_OTHER): Payer: BC Managed Care – PPO

## 2020-09-19 DIAGNOSIS — S20469A Insect bite (nonvenomous) of unspecified back wall of thorax, initial encounter: Secondary | ICD-10-CM | POA: Diagnosis not present

## 2020-09-19 DIAGNOSIS — L519 Erythema multiforme, unspecified: Secondary | ICD-10-CM | POA: Diagnosis not present

## 2020-09-19 DIAGNOSIS — W57XXXA Bitten or stung by nonvenomous insect and other nonvenomous arthropods, initial encounter: Secondary | ICD-10-CM | POA: Diagnosis not present

## 2020-09-19 NOTE — Addendum Note (Signed)
Addended by: Kelle Darting A on: 09/19/2020 04:02 PM   Modules accepted: Orders

## 2020-09-19 NOTE — Progress Notes (Signed)
Sherry Allen

## 2020-09-24 LAB — ROCKY MTN SPOTTED FVR ABS PNL(IGG+IGM)
RMSF IgG: NEGATIVE
RMSF IgM: 0.53 index (ref 0.00–0.89)

## 2020-09-24 LAB — EHRLICHIA ANTIBODY PANEL
E. CHAFFEENSIS AB IGG: <1:64 {titer}
E. CHAFFEENSIS AB IGM: <1:20 {titer}

## 2020-10-08 ENCOUNTER — Encounter: Payer: Self-pay | Admitting: Family Medicine

## 2020-10-08 ENCOUNTER — Other Ambulatory Visit: Payer: Self-pay | Admitting: Family Medicine

## 2020-10-08 ENCOUNTER — Telehealth: Payer: Self-pay

## 2020-10-08 LAB — LYME DISEASE, WESTERN BLOT
IgG P18 Ab.: ABSENT
IgG P23 Ab.: ABSENT
IgG P28 Ab.: ABSENT
IgG P30 Ab.: ABSENT
IgG P39 Ab.: ABSENT
IgG P45 Ab.: ABSENT
IgG P66 Ab.: ABSENT
IgG P93 Ab.: ABSENT
IgM P23 Ab.: ABSENT
IgM P39 Ab.: ABSENT
IgM P41 Ab.: ABSENT
Lyme IgG Wb: NEGATIVE
Lyme IgM Wb: NEGATIVE

## 2020-10-08 MED ORDER — DOXYCYCLINE HYCLATE 100 MG PO TABS: 100.0000 mg | ORAL_TABLET | Freq: Two times a day (BID) | ORAL | 0 refills | Status: DC

## 2020-10-08 NOTE — Telephone Encounter (Signed)
Spoke with pt and she would like her test result reviewed because she seen them on mychart and she was a little concerned about the ones that say present.

## 2020-10-09 DIAGNOSIS — D225 Melanocytic nevi of trunk: Secondary | ICD-10-CM | POA: Diagnosis not present

## 2020-10-09 DIAGNOSIS — L814 Other melanin hyperpigmentation: Secondary | ICD-10-CM | POA: Diagnosis not present

## 2020-10-09 DIAGNOSIS — L308 Other specified dermatitis: Secondary | ICD-10-CM | POA: Diagnosis not present

## 2020-10-09 NOTE — Telephone Encounter (Signed)
Pt aware.

## 2020-10-20 ENCOUNTER — Other Ambulatory Visit: Payer: Self-pay | Admitting: Obstetrics and Gynecology

## 2020-10-20 DIAGNOSIS — Z1231 Encounter for screening mammogram for malignant neoplasm of breast: Secondary | ICD-10-CM

## 2020-11-13 DIAGNOSIS — Z003 Encounter for examination for adolescent development state: Secondary | ICD-10-CM | POA: Diagnosis not present

## 2020-11-13 DIAGNOSIS — E031 Congenital hypothyroidism without goiter: Secondary | ICD-10-CM | POA: Diagnosis not present

## 2020-11-17 DIAGNOSIS — M5126 Other intervertebral disc displacement, lumbar region: Secondary | ICD-10-CM | POA: Diagnosis not present

## 2020-12-12 ENCOUNTER — Ambulatory Visit: Payer: BC Managed Care – PPO | Admitting: Allergy

## 2020-12-22 DIAGNOSIS — Z96642 Presence of left artificial hip joint: Secondary | ICD-10-CM | POA: Diagnosis not present

## 2021-01-09 ENCOUNTER — Ambulatory Visit: Payer: BC Managed Care – PPO | Admitting: Family Medicine

## 2021-01-12 DIAGNOSIS — Z01419 Encounter for gynecological examination (general) (routine) without abnormal findings: Secondary | ICD-10-CM | POA: Diagnosis not present

## 2021-01-12 DIAGNOSIS — Z6824 Body mass index (BMI) 24.0-24.9, adult: Secondary | ICD-10-CM | POA: Diagnosis not present

## 2021-01-30 ENCOUNTER — Ambulatory Visit
Admission: RE | Admit: 2021-01-30 | Discharge: 2021-01-30 | Disposition: A | Discharge status: Home / Self Care | Payer: BC Managed Care – PPO | Source: Ambulatory Visit | Attending: Obstetrics and Gynecology | Admitting: Obstetrics and Gynecology

## 2021-01-30 ENCOUNTER — Other Ambulatory Visit: Payer: Self-pay

## 2021-01-30 DIAGNOSIS — Z1231 Encounter for screening mammogram for malignant neoplasm of breast: Secondary | ICD-10-CM | POA: Diagnosis not present

## 2021-02-04 DIAGNOSIS — E031 Congenital hypothyroidism without goiter: Secondary | ICD-10-CM | POA: Diagnosis not present

## 2021-02-27 ENCOUNTER — Ambulatory Visit: Payer: BC Managed Care – PPO | Admitting: Family Medicine

## 2021-06-11 DIAGNOSIS — E031 Congenital hypothyroidism without goiter: Secondary | ICD-10-CM | POA: Diagnosis not present

## 2021-07-24 DIAGNOSIS — R87612 Low grade squamous intraepithelial lesion on cytologic smear of cervix (LGSIL): Secondary | ICD-10-CM | POA: Diagnosis not present

## 2021-07-24 DIAGNOSIS — R87610 Atypical squamous cells of undetermined significance on cytologic smear of cervix (ASC-US): Secondary | ICD-10-CM | POA: Diagnosis not present

## 2021-10-07 DIAGNOSIS — D2361 Other benign neoplasm of skin of right upper limb, including shoulder: Secondary | ICD-10-CM | POA: Diagnosis not present

## 2021-10-07 DIAGNOSIS — L309 Dermatitis, unspecified: Secondary | ICD-10-CM | POA: Diagnosis not present

## 2021-10-07 DIAGNOSIS — L821 Other seborrheic keratosis: Secondary | ICD-10-CM | POA: Diagnosis not present

## 2021-10-07 DIAGNOSIS — L814 Other melanin hyperpigmentation: Secondary | ICD-10-CM | POA: Diagnosis not present

## 2021-12-30 ENCOUNTER — Other Ambulatory Visit: Payer: Self-pay | Admitting: Obstetrics and Gynecology

## 2021-12-30 DIAGNOSIS — Z1231 Encounter for screening mammogram for malignant neoplasm of breast: Secondary | ICD-10-CM

## 2022-02-10 ENCOUNTER — Ambulatory Visit
Admission: RE | Admit: 2022-02-10 | Discharge: 2022-02-10 | Disposition: A | Discharge status: Home / Self Care | Payer: Commercial Managed Care - HMO | Source: Ambulatory Visit | Attending: Obstetrics and Gynecology | Admitting: Obstetrics and Gynecology

## 2022-02-10 DIAGNOSIS — Z1231 Encounter for screening mammogram for malignant neoplasm of breast: Secondary | ICD-10-CM

## 2022-05-08 IMAGING — MG DIGITAL SCREENING BREAST BILAT IMPLANT W/ TOMO W/ CAD
8 of 12 series · 8 of 28 positions shown · non-contrast
Comparison: Previous exam(s).

CLINICAL DATA: Screening.

EXAM:
DIGITAL SCREENING BILATERAL MAMMOGRAM WITH IMPLANTS, CAD AND
TOMOSYNTHESIS
TECHNIQUE: Bilateral screening digital craniocaudal and mediolateral oblique
mammograms were obtained. Bilateral screening digital breast
tomosynthesis was performed. The images were evaluated with
computer-aided detection. Standard and/or implant displaced views
were performed.

[L MLO]
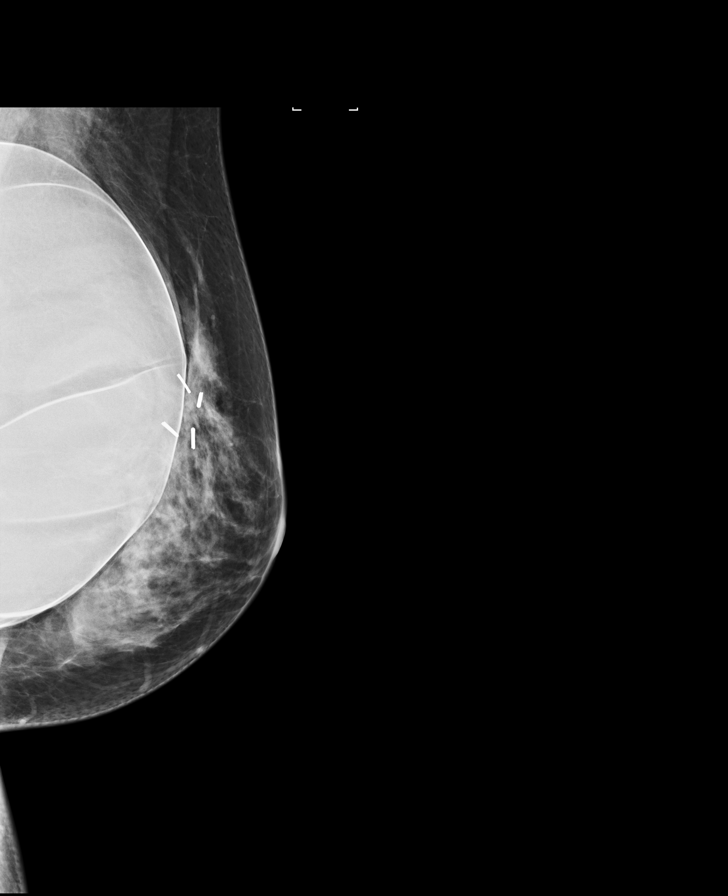

[L CC]
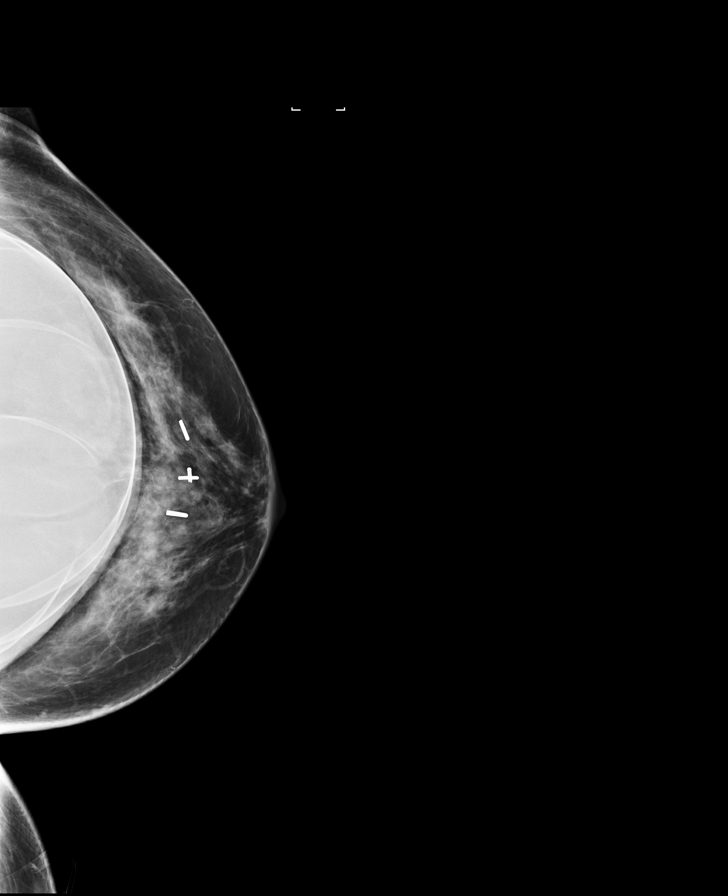

[R CC]
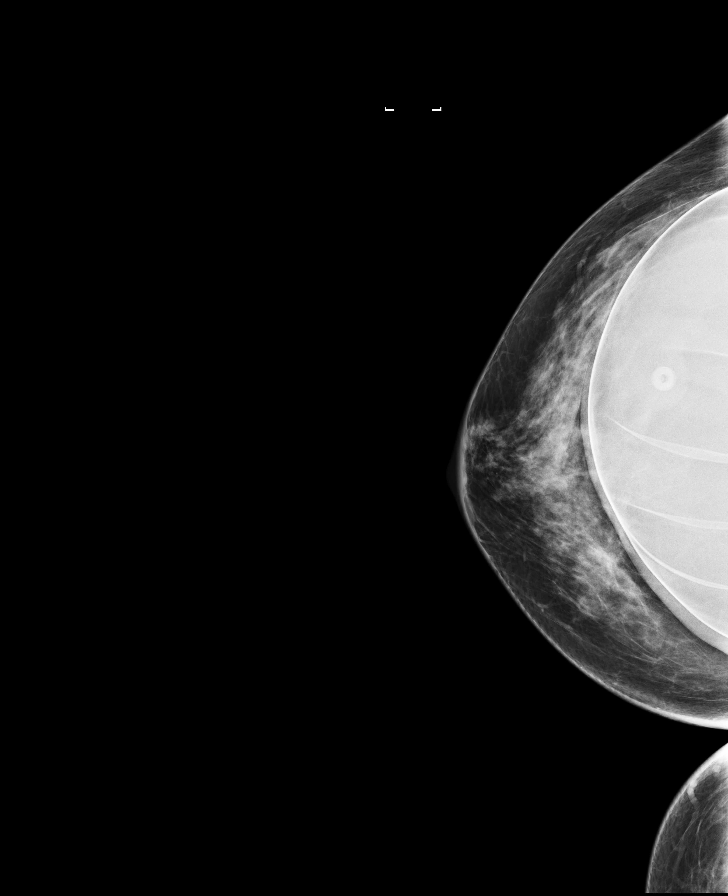

[R MLO]
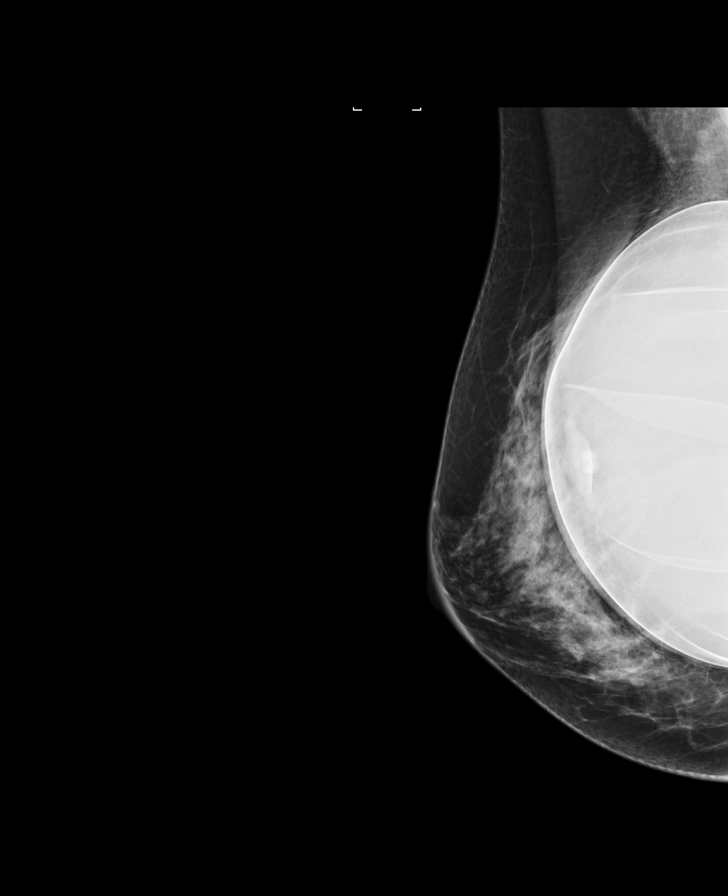

[R MLO synth-2D]
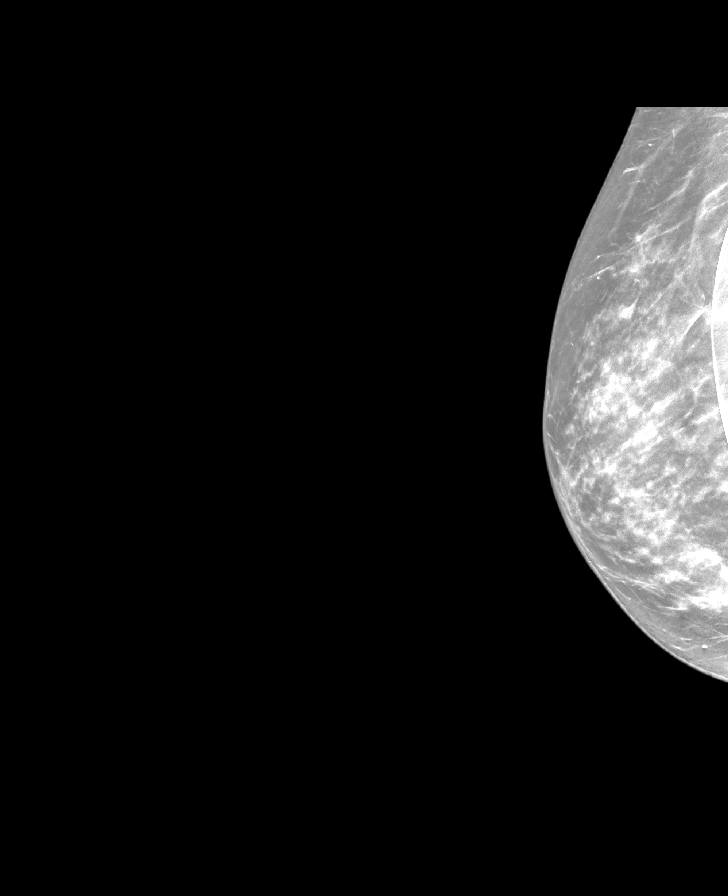

[R CC synth-2D]
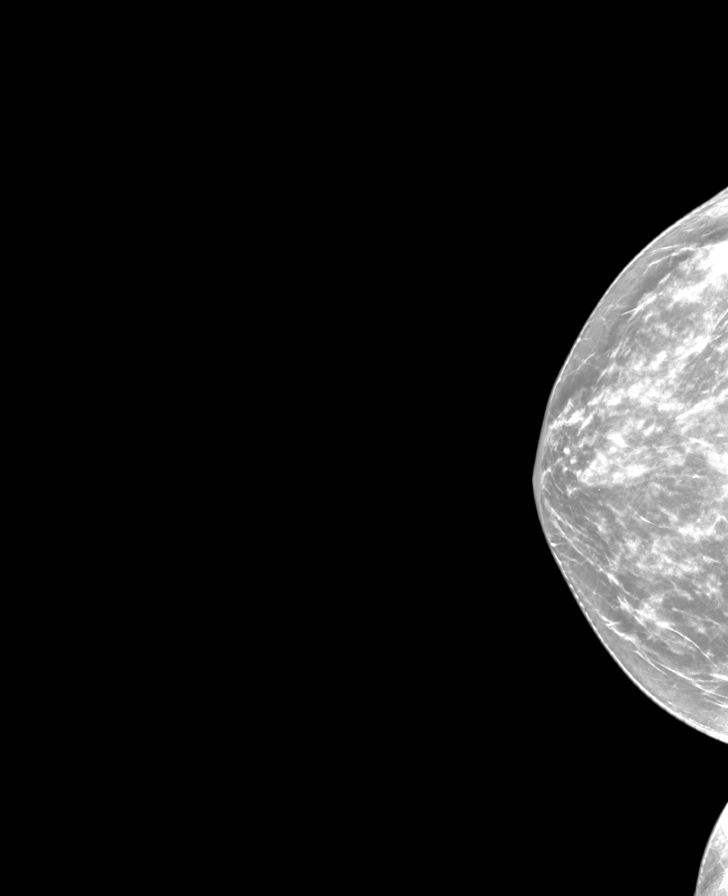

[L CC synth-2D]
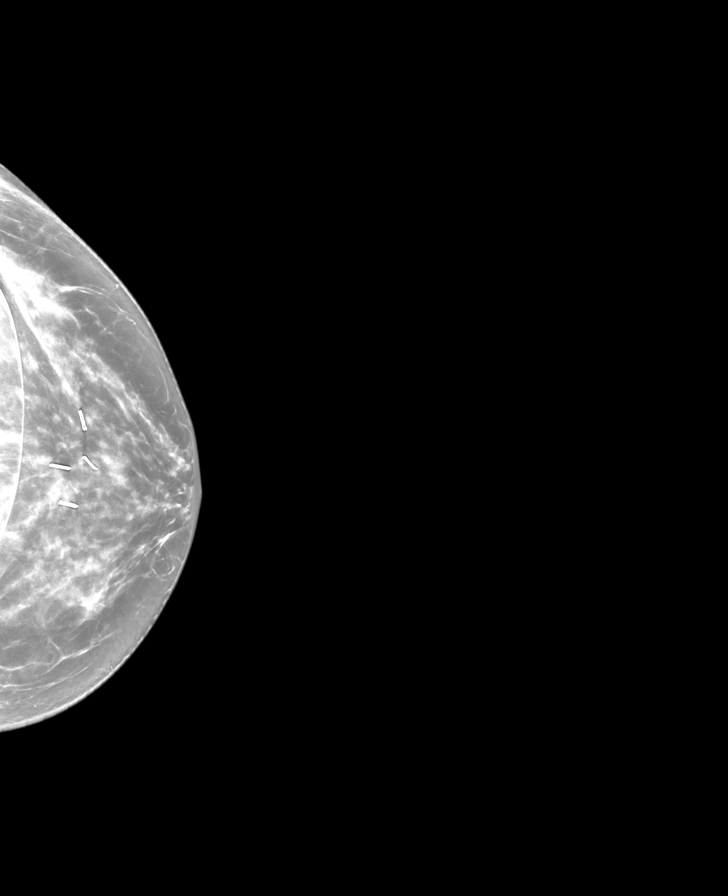

[L MLO synth-2D]
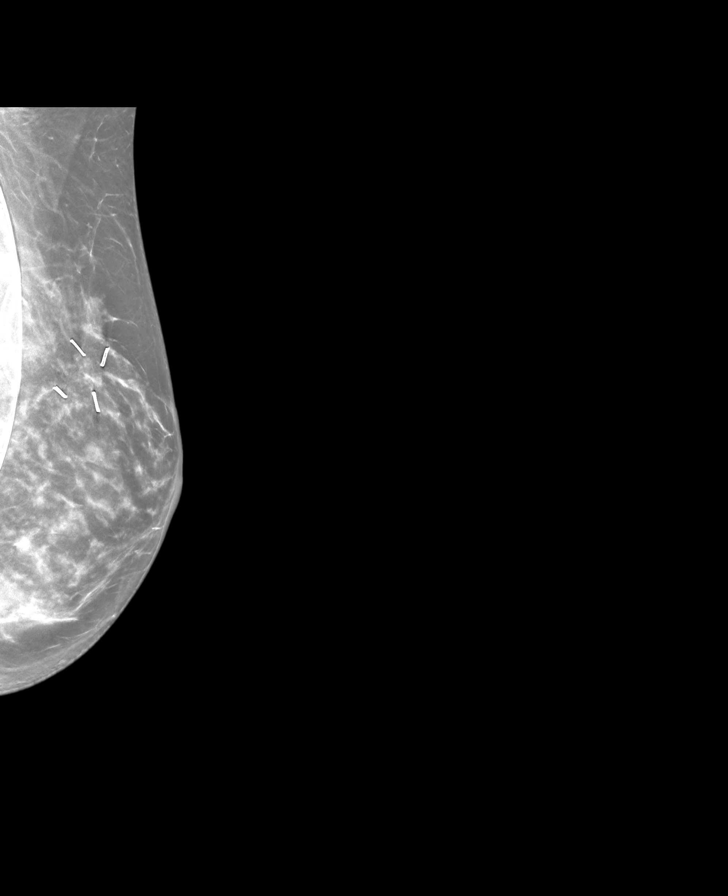

[8 of 28 positions shown; findings below may reference images not displayed]

ACR Breast Density Category c: The breast tissue is heterogeneously
dense, which may obscure small masses.
FINDINGS: The patient has retropectoral implants. There are no findings
suspicious for malignancy.
IMPRESSION: No mammographic evidence of malignancy. A result letter of this
screening mammogram will be mailed directly to the patient.

RECOMMENDATION:
Screening mammogram in one year. (Code:LT-E-7TH)

BI-RADS CATEGORY  1:  Negative.

## 2023-01-05 ENCOUNTER — Other Ambulatory Visit: Payer: Self-pay | Admitting: Obstetrics and Gynecology

## 2023-01-05 DIAGNOSIS — Z1231 Encounter for screening mammogram for malignant neoplasm of breast: Secondary | ICD-10-CM

## 2023-02-15 ENCOUNTER — Ambulatory Visit
Admission: RE | Admit: 2023-02-15 | Discharge: 2023-02-15 | Disposition: A | Discharge status: Home / Self Care | Payer: Managed Care, Other (non HMO) | Source: Ambulatory Visit | Attending: Obstetrics and Gynecology | Admitting: Obstetrics and Gynecology

## 2023-02-15 DIAGNOSIS — Z1231 Encounter for screening mammogram for malignant neoplasm of breast: Secondary | ICD-10-CM

## 2023-04-21 ENCOUNTER — Other Ambulatory Visit: Payer: Self-pay | Admitting: Obstetrics and Gynecology

## 2023-04-21 DIAGNOSIS — R7401 Elevation of levels of liver transaminase levels: Secondary | ICD-10-CM

## 2023-04-22 ENCOUNTER — Ambulatory Visit
Admission: RE | Admit: 2023-04-22 | Discharge: 2023-04-22 | Disposition: A | Discharge status: Home / Self Care | Payer: Commercial Managed Care - HMO | Source: Ambulatory Visit | Attending: Obstetrics and Gynecology | Admitting: Obstetrics and Gynecology

## 2023-04-22 DIAGNOSIS — R7401 Elevation of levels of liver transaminase levels: Secondary | ICD-10-CM

## 2023-05-24 ENCOUNTER — Encounter: Payer: Self-pay | Admitting: Gastroenterology

## 2023-07-04 ENCOUNTER — Ambulatory Visit: Payer: Commercial Managed Care - HMO | Admitting: Gastroenterology

## 2023-08-17 ENCOUNTER — Ambulatory Visit: Admitting: Gastroenterology

## 2023-08-17 ENCOUNTER — Encounter: Payer: Self-pay | Admitting: Gastroenterology

## 2023-08-17 ENCOUNTER — Other Ambulatory Visit (INDEPENDENT_AMBULATORY_CARE_PROVIDER_SITE_OTHER)

## 2023-08-17 VITALS — BP 122/68 | HR 84 | Ht 67.0 in | Wt 166.0 lb

## 2023-08-17 DIAGNOSIS — R053 Chronic cough: Secondary | ICD-10-CM

## 2023-08-17 DIAGNOSIS — R09A2 Foreign body sensation, throat: Secondary | ICD-10-CM | POA: Diagnosis not present

## 2023-08-17 DIAGNOSIS — R7989 Other specified abnormal findings of blood chemistry: Secondary | ICD-10-CM

## 2023-08-17 DIAGNOSIS — K224 Dyskinesia of esophagus: Secondary | ICD-10-CM

## 2023-08-17 LAB — HEPATIC FUNCTION PANEL
ALT: 41 U/L — ABNORMAL HIGH (ref 0–35)
AST: 21 U/L (ref 0–37)
Albumin: 4.6 g/dL (ref 3.5–5.2)
Alkaline Phosphatase: 69 U/L (ref 39–117)
Bilirubin, Direct: 0.1 mg/dL (ref 0.0–0.3)
Total Bilirubin: 0.4 mg/dL (ref 0.2–1.2)
Total Protein: 7.3 g/dL (ref 6.0–8.3)

## 2023-08-17 LAB — IBC + FERRITIN
Ferritin: 76.3 ng/mL (ref 10.0–291.0)
Iron: 84 ug/dL (ref 42–145)
Saturation Ratios: 26.3 % (ref 20.0–50.0)
TIBC: 319.2 ug/dL (ref 250.0–450.0)
Transferrin: 228 mg/dL (ref 212.0–360.0)

## 2023-08-17 LAB — PROTIME-INR
INR: 1 ratio (ref 0.8–1.0)
Prothrombin Time: 11 s (ref 9.6–13.1)

## 2023-08-17 NOTE — Patient Instructions (Signed)
 Your provider has requested that you go to the basement level for lab work before leaving today. Press "B" on the elevator. The lab is located at the first door on the left as you exit the elevator.  _______________________________________________________  If your blood pressure at your visit was 140/90 or greater, please contact your primary care physician to follow up on this.  _______________________________________________________  If you are age 53 or older, your body mass index should be between 23-30. Your Body mass index is 26 kg/m. If this is out of the aforementioned range listed, please consider follow up with your Primary Care Provider.  If you are age 35 or younger, your body mass index should be between 19-25. Your Body mass index is 26 kg/m. If this is out of the aformentioned range listed, please consider follow up with your Primary Care Provider.   ________________________________________________________  The [facility] providers would like to encourage you to use MYCHART to communicate with providers for non-urgent requests or questions.  Due to long hold times on the telephone, sending your provider a message by [facility] may be a faster and more efficient way to get a response.  Please allow 48 business hours for a response.  Please remember that this is for non-urgent requests.  _______________________________________________________ Thank you for trusting me with your gastrointestinal care!   Dyanna Glasgow, NP
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
## 2023-08-20 LAB — IGG: IgG (Immunoglobin G), Serum: 1124 mg/dL (ref 600–1640)

## 2023-08-20 LAB — ALPHA-1-ANTITRYPSIN: A-1 Antitrypsin, Ser: 141 mg/dL (ref 83–199)

## 2023-08-20 LAB — HEPATITIS A ANTIBODY, TOTAL: Hepatitis A AB,Total: NONREACTIVE

## 2023-08-20 LAB — MITOCHONDRIAL ANTIBODIES: Mitochondrial M2 Ab, IgG: <=20 U (ref <=20.0)

## 2023-08-20 LAB — TISSUE TRANSGLUTAMINASE ABS,IGG,IGA
(tTG) Ab, IgA: <1 U/mL
(tTG) Ab, IgG: <1 U/mL

## 2023-08-20 LAB — HEPATITIS B SURFACE ANTIGEN: Hepatitis B Surface Ag: NONREACTIVE

## 2023-08-20 LAB — ANA: Anti Nuclear Antibody (ANA): POSITIVE — AB

## 2023-08-20 LAB — HEPATITIS C ANTIBODY: Hepatitis C Ab: NONREACTIVE

## 2023-08-20 LAB — ANTI-NUCLEAR AB-TITER (ANA TITER): ANA Titer 1: 1:80 {titer} — ABNORMAL HIGH

## 2023-08-20 LAB — HEPATITIS B SURFACE ANTIBODY,QUALITATIVE: Hep B S Ab: NONREACTIVE

## 2023-08-20 LAB — CERULOPLASMIN: Ceruloplasmin: 27 mg/dL (ref 14–48)

## 2023-08-20 LAB — IGA: Immunoglobulin A: 216 mg/dL (ref 47–310)

## 2023-08-20 LAB — ANTI-SMOOTH MUSCLE ANTIBODY, IGG: Actin (Smooth Muscle) Antibody (IGG): <20 U (ref <20)

## 2023-08-21 NOTE — Progress Notes (Signed)
 Agree with assessment/plan. WU is neg. Only ALT 41. Neg US   OK to use hyoscyamine  Repeat LFTs in 4 weeks  Magnus Schuller, MD [facility] [phone]

## 2023-08-22 ENCOUNTER — Other Ambulatory Visit: Payer: Self-pay | Admitting: *Deleted

## 2023-08-22 DIAGNOSIS — R7989 Other specified abnormal findings of blood chemistry: Secondary | ICD-10-CM

## 2023-10-27 ENCOUNTER — Other Ambulatory Visit

## 2023-10-27 DIAGNOSIS — R7989 Other specified abnormal findings of blood chemistry: Secondary | ICD-10-CM | POA: Diagnosis not present

## 2023-10-27 LAB — HEPATIC FUNCTION PANEL
ALT: 32 U/L (ref 0–35)
AST: 22 U/L (ref 0–37)
Albumin: 4.8 g/dL (ref 3.5–5.2)
Alkaline Phosphatase: 57 U/L (ref 39–117)
Bilirubin, Direct: 0.1 mg/dL (ref 0.0–0.3)
Total Bilirubin: 0.4 mg/dL (ref 0.2–1.2)
Total Protein: 7.3 g/dL (ref 6.0–8.3)

## 2023-10-31 ENCOUNTER — Ambulatory Visit: Payer: Self-pay | Admitting: Gastroenterology

## 2023-10-31 DIAGNOSIS — R7989 Other specified abnormal findings of blood chemistry: Secondary | ICD-10-CM

## 2023-10-31 NOTE — Addendum Note (Signed)
 Addended by: Reizy Dunlow N on: 10/31/2023 12:30 PM   Modules accepted: Orders

## 2024-01-16 ENCOUNTER — Other Ambulatory Visit: Payer: Self-pay | Admitting: Obstetrics and Gynecology

## 2024-01-16 DIAGNOSIS — Z1231 Encounter for screening mammogram for malignant neoplasm of breast: Secondary | ICD-10-CM

## 2024-02-16 ENCOUNTER — Ambulatory Visit
Admission: RE | Admit: 2024-02-16 | Discharge: 2024-02-16 | Disposition: A | Discharge status: Home / Self Care | Source: Ambulatory Visit | Attending: Obstetrics and Gynecology | Admitting: Obstetrics and Gynecology

## 2024-02-16 DIAGNOSIS — Z1231 Encounter for screening mammogram for malignant neoplasm of breast: Secondary | ICD-10-CM
